# Patient Record
Sex: Female | Born: 1950
Health system: Southern US, Community
[De-identification: ages and names within clinical notes are randomized; demographics above are authoritative.]

## PROBLEM LIST (undated history)

## (undated) DIAGNOSIS — M7061 Trochanteric bursitis, right hip: Secondary | ICD-10-CM

## (undated) DIAGNOSIS — H04129 Dry eye syndrome of unspecified lacrimal gland: Secondary | ICD-10-CM

## (undated) DIAGNOSIS — M1990 Unspecified osteoarthritis, unspecified site: Secondary | ICD-10-CM

## (undated) DIAGNOSIS — G609 Hereditary and idiopathic neuropathy, unspecified: Secondary | ICD-10-CM

## (undated) DIAGNOSIS — F339 Major depressive disorder, recurrent, unspecified: Secondary | ICD-10-CM

## (undated) DIAGNOSIS — G2581 Restless legs syndrome: Secondary | ICD-10-CM

## (undated) DIAGNOSIS — H40013 Open angle with borderline findings, low risk, bilateral: Secondary | ICD-10-CM

## (undated) DIAGNOSIS — H903 Sensorineural hearing loss, bilateral: Secondary | ICD-10-CM

## (undated) DIAGNOSIS — H43813 Vitreous degeneration, bilateral: Secondary | ICD-10-CM

## (undated) DIAGNOSIS — J309 Allergic rhinitis, unspecified: Secondary | ICD-10-CM

## (undated) DIAGNOSIS — K219 Gastro-esophageal reflux disease without esophagitis: Secondary | ICD-10-CM

## (undated) DIAGNOSIS — Z170 Estrogen receptor positive status [ER+]: Secondary | ICD-10-CM

## (undated) DIAGNOSIS — E785 Hyperlipidemia, unspecified: Secondary | ICD-10-CM

## (undated) DIAGNOSIS — C50912 Malignant neoplasm of unspecified site of left female breast: Secondary | ICD-10-CM

## (undated) DIAGNOSIS — H35072 Retinal telangiectasis, left eye: Secondary | ICD-10-CM

## (undated) DIAGNOSIS — C50512 Malignant neoplasm of lower-outer quadrant of left female breast: Secondary | ICD-10-CM

## (undated) DIAGNOSIS — H35371 Puckering of macula, right eye: Secondary | ICD-10-CM

## (undated) DIAGNOSIS — G4700 Insomnia, unspecified: Secondary | ICD-10-CM

## (undated) DIAGNOSIS — I10 Essential (primary) hypertension: Secondary | ICD-10-CM

## (undated) DIAGNOSIS — Z961 Presence of intraocular lens: Secondary | ICD-10-CM

## (undated) HISTORY — DX: Retinal telangiectasis, left eye: H35.072

## (undated) HISTORY — DX: Allergic rhinitis, unspecified: J30.9

## (undated) HISTORY — PX: BREAST LUMPECTOMY: SHX2

## (undated) HISTORY — DX: Puckering of macula, right eye: H35.371

## (undated) HISTORY — DX: Estrogen receptor positive status (ER+): Z17.0

## (undated) HISTORY — DX: Major depressive disorder, recurrent, unspecified: F33.9

## (undated) HISTORY — DX: Restless legs syndrome: G25.81

## (undated) HISTORY — DX: Trochanteric bursitis, right hip: M70.61

## (undated) HISTORY — DX: Essential (primary) hypertension: I10

## (undated) HISTORY — DX: Estrogen receptor positive status (ER+): C50.512

## (undated) HISTORY — PX: REPLACEMENT TOTAL KNEE BILATERAL: SUR1225

## (undated) HISTORY — DX: Hereditary and idiopathic neuropathy, unspecified: G60.9

## (undated) HISTORY — DX: Dry eye syndrome of unspecified lacrimal gland: H04.129

## (undated) HISTORY — DX: Sensorineural hearing loss, bilateral: H90.3

## (undated) HISTORY — DX: Insomnia, unspecified: G47.00

## (undated) HISTORY — DX: Unspecified osteoarthritis, unspecified site: M19.90

## (undated) HISTORY — DX: Hyperlipidemia, unspecified: E78.5

## (undated) HISTORY — DX: Gastro-esophageal reflux disease without esophagitis: K21.9

## (undated) HISTORY — DX: Malignant neoplasm of unspecified site of left female breast: C50.912

## (undated) HISTORY — DX: Presence of intraocular lens: Z96.1

## (undated) HISTORY — PX: ROTATOR CUFF REPAIR: SHX139

## (undated) HISTORY — DX: Open angle with borderline findings, low risk, bilateral: H40.013

## (undated) HISTORY — DX: Vitreous degeneration, bilateral: H43.813

---

## 2019-01-03 ENCOUNTER — Ambulatory Visit (INDEPENDENT_AMBULATORY_CARE_PROVIDER_SITE_OTHER): Payer: Medicare Other | Admitting: Allergy and Immunology

## 2019-01-03 ENCOUNTER — Encounter: Payer: Self-pay | Admitting: Allergy and Immunology

## 2019-01-03 VITALS — BP 152/70 | HR 80 | Temp 98.5°F | Resp 12 | Ht 63.5 in | Wt 237.6 lb

## 2019-01-03 DIAGNOSIS — J3089 Other allergic rhinitis: Secondary | ICD-10-CM

## 2019-01-03 DIAGNOSIS — L299 Pruritus, unspecified: Secondary | ICD-10-CM | POA: Diagnosis not present

## 2019-01-03 DIAGNOSIS — K219 Gastro-esophageal reflux disease without esophagitis: Secondary | ICD-10-CM

## 2019-01-03 DIAGNOSIS — J4540 Moderate persistent asthma, uncomplicated: Secondary | ICD-10-CM

## 2019-01-03 MED ORDER — FLUTICASONE PROPIONATE 50 MCG/ACT NA SUSP: NASAL | 5 refills | Status: DC

## 2019-01-03 MED ORDER — OMEPRAZOLE 40 MG PO CPDR: DELAYED_RELEASE_CAPSULE | ORAL | 1 refills | Status: DC

## 2019-01-03 NOTE — Progress Notes (Signed)
NEW PATIENT NOTE  Referring Provider: No ref. provider found Primary Provider: Leota Jacobsen, MD Date of office visit: 01/03/2019    Subjective:   Chief Complaint:  Brittany Berger (DOB: October 23, 1951) is a 68 y.o. female who presents to the clinic on 01/03/2019 with a chief complaint of Pruritis .  HPI: Brittany Berger presents to this clinic in evaluation of multiple issues.  First, she has been itching since July 2019.  She never really notices a rash but she is itching "deep inside".  There appears to be predilection for her buttocks and thighs but any area of her body can be itchy.  This is disturbing her sleep even though she consistently uses hydroxyzine twice a day and occasionally uses Benadryl.  She has been given triamcinolone cream which does not really help her at all.  She takes no oatmeal bath.  She has seen a dermatologist last week and apparently no specific therapy was prescribed.  Her skin history goes back to an episode of folliculitis in the spring that was treated with 3 months of doxycycline which she finished in May 2019.  She has eliminated the use of triamterene and hydrochlorothiazide for the past 2 weeks and she has eliminated the use of letrozole for the past 2 months yet still remains itchy.  Second, she apparently underwent chemotherapy, radiation, and immunotherapy for breast cancer which she finished in July 2019.  Third, she has cough throughout the day.  She has a morning cough where she coughs up ugly sputum and this has been present for years.  Apparently she was told that she has asthma and she has been using a short acting bronchodilator every morning.  Fourth, she has persistent nasal congestion and sneezing and she has a diminished ability to smell.  She does not really notice any ugly nasal discharge.  She is using her husband's nose spray that he receives from the [place].  Fifth, she has the sensation that there is something in her throat.  She gets hoarseness.   She does have a history of reflux requiring esophageal dilation for food obstruction in the past and she is currently using Prilosec.  She drinks 2 coffees per day and occasionally a soda.  Past Medical History:  Diagnosis Date  . Allergic rhinitis   . Dry eye syndrome   . Epiretinal membrane (ERM) of right eye   . GERD (gastroesophageal reflux disease)   . Hyperlipidemia   . Hypertension   . Idiopathic peripheral neuropathy   . Insomnia   . Invasive ductal carcinoma of breast, female, left ([place])   . Malignant neoplasm of lower-outer quadrant of left breast of female, estrogen receptor positive ([place])   . Open angle with borderline findings and low glaucoma risk in both eyes   . Osteoarthritis   . Pseudophakia of both eyes   . PVD (posterior vitreous detachment), both eyes   . Recurrent major depressive disorder ([place])   . Restless leg syndrome   . Retinal telangiectasis of left eye   . Sensorineural hearing loss (SNHL) of both ears   . Trochanteric bursitis of right hip     Past Surgical History:  Procedure Laterality Date  . BREAST LUMPECTOMY    . REPLACEMENT TOTAL KNEE BILATERAL    . ROTATOR CUFF REPAIR Bilateral     Allergies as of 01/03/2019      Reactions   Pregabalin Shortness Of Breath   Ace Inhibitors Other (See Comments)   Hydromorphone Itching  Meperidine Itching      Medication List      albuterol 108 (90 Base) MCG/ACT inhaler Commonly known as:  PROVENTIL HFA;VENTOLIN HFA Inhale 2 puffs every 4 to 6 hours as needed for wheezing.   amLODipine 5 MG tablet Commonly known as:  NORVASC TAKE 1 TABLET EVERY DAY   atorvastatin 10 MG tablet Commonly known as:  LIPITOR   baclofen 10 MG tablet Commonly known as:  LIORESAL Take 10 mg by mouth at bedtime.   escitalopram 10 MG tablet Commonly known as:  LEXAPRO   furosemide 20 MG tablet Commonly known as:  LASIX Take 20 mg by mouth daily as needed.   gabapentin 600 MG tablet Commonly known as:   NEURONTIN   hydrOXYzine 25 MG tablet Commonly known as:  ATARAX/VISTARIL TAKE 1 TABLET BY MOUTH EVERY 4 HOURS AS NEEDED FOR ITCHING   letrozole 2.5 MG tablet Commonly known as:  FEMARA Take by mouth.   LORazepam 1 MG tablet Commonly known as:  ATIVAN Take 1 mg by mouth at bedtime.   montelukast 10 MG tablet Commonly known as:  SINGULAIR   omeprazole 20 MG capsule Commonly known as:  PRILOSEC   potassium chloride 10 MEQ CR capsule Commonly known as:  MICRO-K Take by mouth.   QUEtiapine 100 MG tablet Commonly known as:  SEROQUEL TAKE 0.5 1 (ONE HALF TO ONE) TABLET BY MOUTH AT BEDTIME FOR MOOD STABILIZER INSOMNIA   SM OYSTER SHELL CALCIUM/VIT D3 PO Take by mouth.   triamcinolone cream 0.1 % Commonly known as:  KENALOG APPLY CREAM EXTERNALLY TO AFFECTED AREA TWICE DAILY   triamterene-hydrochlorothiazide 37.5-25 MG capsule Commonly known as:  DYAZIDE   Vitamin B-Complex Tabs Take by mouth.   zolpidem 10 MG tablet Commonly known as:  AMBIEN TAKE 1 2 TO 1 (ONE HALF TO ONE) TABLET BY MOUTH EVERY DAY AT BEDTIME AS NEEDED FOR INSOMNIA       Review of systems negative except as noted in HPI / PMHx or noted below:  Review of Systems  Constitutional: Negative.   HENT: Negative.   Eyes: Negative.   Respiratory: Negative.   Cardiovascular: Negative.   Gastrointestinal: Negative.   Genitourinary: Negative.   Musculoskeletal: Negative.   Skin: Negative.   Neurological: Negative.   Endo/Heme/Allergies: Negative.   Psychiatric/Behavioral: Negative.     Family History  Problem Relation Age of Onset  . Emphysema Father   . Aneurysm Father   . Allergic rhinitis Sister   . Cancer Sister   . Allergic rhinitis Sister     Social History   Socioeconomic History  . Marital status: Married    Spouse name: Not on file  . Number of children: Not on file  . Years of education: Not on file  . Highest education level: Not on file  Occupational History  . Not on file   Social Needs  . Financial resource strain: Not on file  . Food insecurity:    Worry: Not on file    Inability: Not on file  . Transportation needs:    Medical: Not on file    Non-medical: Not on file  Tobacco Use  . Smoking status: Never Smoker  . Smokeless tobacco: Never Used  Substance and Sexual Activity  . Alcohol use: Not on file  . Drug use: Not on file  . Sexual activity: Not on file  Lifestyle  . Physical activity:    Days per week: Not on file    Minutes per session:  Not on file  . Stress: Not on file  Relationships  . Social connections:    Talks on phone: Not on file    Gets together: Not on file    Attends religious service: Not on file    Active member of club or organization: Not on file    Attends meetings of clubs or organizations: Not on file    Relationship status: Not on file  . Intimate partner violence:    Fear of current or ex partner: Not on file    Emotionally abused: Not on file    Physically abused: Not on file    Forced sexual activity: Not on file  Other Topics Concern  . Not on file  Social History Narrative  . Not on file    Environmental and Social history  Lives in a house with a dry environment, dogs located inside the household, no carpet in the bedroom, no plastic on the bed, no plastic on the pillow, no smokers located inside the household.  Objective:   Vitals:   01/03/19 1024  BP: (!) 152/70  Pulse: 80  Resp: 12  Temp: 98.5 F (36.9 C)   Height: 5' 3.5" (161.3 cm) Weight: 237 lb 9.6 oz (107.8 kg)  Physical Exam Constitutional:      Appearance: She is not diaphoretic.     Comments: Nasal voice, throat clearing  HENT:     Head: Normocephalic. No right periorbital erythema or left periorbital erythema.     Right Ear: Tympanic membrane, ear canal and external ear normal.     Left Ear: Tympanic membrane, ear canal and external ear normal.     Nose: Mucosal edema present. No rhinorrhea.     Mouth/Throat:     Mouth:  Mucous membranes are dry.     Pharynx: Uvula midline. No oropharyngeal exudate.  Eyes:     General: Lids are normal.     Conjunctiva/sclera: Conjunctivae normal.     Pupils: Pupils are equal, round, and reactive to light.  Neck:     Thyroid: No thyromegaly.     Trachea: Trachea normal. No tracheal tenderness or tracheal deviation.  Cardiovascular:     Rate and Rhythm: Normal rate and regular rhythm.     Heart sounds: Normal heart sounds, S1 normal and S2 normal. No murmur.  Pulmonary:     Effort: Pulmonary effort is normal. No respiratory distress.     Breath sounds: Normal breath sounds. No stridor. No wheezing or rales.  Chest:     Chest wall: No tenderness.  Abdominal:     General: There is no distension.     Palpations: Abdomen is soft. There is no mass.     Tenderness: There is no abdominal tenderness. There is no guarding or rebound.  Musculoskeletal:        General: No tenderness.  Lymphadenopathy:     Head:     Right side of head: No tonsillar adenopathy.     Left side of head: No tonsillar adenopathy.     Cervical: No cervical adenopathy.  Skin:    Coloration: Skin is not pale.     Findings: No erythema or rash.     Nails: There is no clubbing.   Neurological:     Mental Status: She is alert.       Diagnostics: Allergy skin tests were not performed secondary to the recent use of a antihistamine.   Spirometry was performed and demonstrated an FEV1 of 1.52 @ 65 %  of predicted. FEV1/FVC = 0.75.  Following administration of nebulized albuterol her FEV1 rose to 1.77 which was an increase in the FEV1 of 16%.   Results of a head CT scan obtained 16 August 2018 identified no acute findings in sinus/orbits.  Results of blood tests obtained 19 December 2018 identified creatinine 0.92 mg/DL, AST 19 U/L, ALT 20 4U/L, WBC 8.1, absolute eosinophil 200, absolute lymphocyte 2800, hemoglobin 13.7, platelet 194.   Assessment and Plan:    1. Pruritic disorder   2. Not well  controlled moderate persistent asthma   3. Perennial allergic rhinitis   4. LPRD (laryngopharyngeal reflux disease)     1.  Allergen avoidance measures?  2.  Obtain blood - IgA/G/M, TSH, T4, TP, area 2 profile  3.  Obtain chest x-ray  4.  Treat and prevent inflammation:   A.  Symbicort 160 - 2 inhalations twice a day with spacer  B.  Flonase -1 spray each nostril twice a day  C.  Montelukast 10 mg -1 tablet 1 time per day  5.  Treat and prevent reflux:   A.  Slowly taper off all forms of caffeine  B.  Increase omeprazole 40 mg twice a day  6.  Raise itch threshold:   A.  Xyzal 5 mg -1 tablet twice a day  B.  Diphenhydramine 25 mg -1 tablet twice a day  C.  Eliminate use of hydroxyzine  D.  Shower/bath immediately followed by Vaseline  7.  If needed:   A.  Nasal saline  B.  Albuterol MDI 2 inhalations every 4-6 hours  8.  Return to clinic in 3 weeks or earlier if problem  The etiologic factor responsible for Aniqua's pruritic disorder is not entirely clear but we will screen her blood and also image her airway looking for a systemic disease contributing to her this immunological hyperreactivity.  As well, she appears to have inflammation of her airway and active reflux disease and we will address those issues with the therapy noted above.  Finally, she did have rather extensive chemotherapy administered for her breast cancer and she does appear to have a history of having recurrent purulent sputum and we will check her immunoglobulin levels to make sure that her B-cell immune system is working. I will see her back in his clinic in 3 weeks or earlier if there is a problem.  Allena Katz, MD Allergy / Immunology [place]

## 2019-01-03 NOTE — Patient Instructions (Addendum)
  1.  Allergen avoidance measures?  2.  Obtain blood - IgA/G/M, TSH, T4, TP, area 2 profile  3.  Obtain chest x-ray  4.  Treat and prevent inflammation:   A.  Symbicort 160 - 2 inhalations twice a day with spacer  B.  Flonase -1 spray each nostril twice a day  C.  Montelukast 10 mg -1 tablet 1 time per day  5.  Treat and prevent reflux:   A.  Slowly taper off all forms of caffeine  B.  Increase omeprazole 40 mg twice a day  6.  Raise itch threshold:   A.  Xyzal 5 mg -1 tablet twice a day  B.  Diphenhydramine 25 mg -1 tablet twice a day  C.  Eliminate use of hydroxyzine  D.  Shower/bath immediately followed by Vaseline  7.  If needed:   A.  Nasal saline  B.  Albuterol MDI 2 inhalations every 4-6 hours  8.  Return to clinic in 3 weeks or earlier if problem

## 2019-01-04 ENCOUNTER — Encounter: Payer: Self-pay | Admitting: Allergy and Immunology

## 2019-01-07 LAB — ALLERGENS W/TOTAL IGE AREA 2
Alternaria Alternata IgE: <0.1 kU/L
Aspergillus Fumigatus IgE: <0.1 kU/L
Bermuda Grass IgE: <0.1 kU/L
Cedar, Mountain IgE: <0.1 kU/L
Cockroach, German IgE: 0.2 kU/L — AB
Common Silver Birch IgE: <0.1 kU/L
D Farinae IgE: 6.03 kU/L — AB
D Pteronyssinus IgE: 13.3 kU/L — AB
Dog Dander IgE: 0.7 kU/L — AB
E001-IGE CAT DANDER: 0.4 kU/L — AB
Elm, American IgE: 0.14 kU/L — AB
IgE (Immunoglobulin E), Serum: 100 IU/mL (ref 6–495)
Johnson Grass IgE: <0.1 kU/L
Maple/Box Elder IgE: <0.1 kU/L
Mouse Urine IgE: <0.1 kU/L
Oak, White IgE: <0.1 kU/L
Pecan, Hickory IgE: 1.1 kU/L — AB
Penicillium Chrysogen IgE: <0.1 kU/L
Pigweed, Rough IgE: 0.11 kU/L — AB
Ragweed, Short IgE: 0.49 kU/L — AB
Sheep Sorrel IgE Qn: <0.1 kU/L
Timothy Grass IgE: 0.18 kU/L — AB
White Mulberry IgE: <0.1 kU/L

## 2019-01-07 LAB — THYROID PEROXIDASE ANTIBODY: Thyroperoxidase Ab SerPl-aCnc: <6 IU/mL (ref 0–34)

## 2019-01-07 LAB — IGG, IGA, IGM
IgA/Immunoglobulin A, Serum: 181 mg/dL (ref 87–352)
IgG (Immunoglobin G), Serum: 899 mg/dL (ref 700–1600)
IgM (Immunoglobulin M), Srm: 38 mg/dL (ref 26–217)

## 2019-01-07 LAB — TSH+FREE T4
Free T4: 1.14 ng/dL (ref 0.82–1.77)
TSH: 1.53 u[IU]/mL (ref 0.450–4.500)

## 2019-01-09 ENCOUNTER — Encounter: Payer: Self-pay | Admitting: *Deleted

## 2019-01-24 ENCOUNTER — Encounter: Payer: Self-pay | Admitting: Allergy and Immunology

## 2019-01-24 ENCOUNTER — Ambulatory Visit (INDEPENDENT_AMBULATORY_CARE_PROVIDER_SITE_OTHER): Payer: Medicare Other | Admitting: Allergy and Immunology

## 2019-01-24 VITALS — BP 120/70 | HR 78 | Resp 18

## 2019-01-24 DIAGNOSIS — K219 Gastro-esophageal reflux disease without esophagitis: Secondary | ICD-10-CM

## 2019-01-24 DIAGNOSIS — J4540 Moderate persistent asthma, uncomplicated: Secondary | ICD-10-CM

## 2019-01-24 DIAGNOSIS — J3089 Other allergic rhinitis: Secondary | ICD-10-CM | POA: Diagnosis not present

## 2019-01-24 DIAGNOSIS — L299 Pruritus, unspecified: Secondary | ICD-10-CM

## 2019-01-24 DIAGNOSIS — G4733 Obstructive sleep apnea (adult) (pediatric): Secondary | ICD-10-CM

## 2019-01-24 NOTE — Patient Instructions (Addendum)
  1.  Continue to treat and prevent inflammation:   A.  Symbicort 160 - 2 inhalations twice a day with spacer  B.  Flonase -1 spray each nostril twice a day  C.  Montelukast 10 mg -1 tablet 1 time per day  2.  Continue to treat and prevent reflux:   A.  Slowly taper off all forms of caffeine  B.  omeprazole 40 mg twice a day  3.  Continue to raise itch threshold:   A.  Xyzal 5 mg -1 tablet twice a day  B.  Diphenhydramine 25 mg -1 tablet twice a day  C.  Shower/bath immediately followed by Vaseline or triamcinolone  4.  If needed:   A.  Nasal saline  B.  Albuterol MDI 2 inhalations every 4-6 hours  5.  Return to clinic in 8 weeks or earlier if problem  6. Arrange for sleep study

## 2019-01-24 NOTE — Progress Notes (Signed)
Follow-up Note  Referring Provider: No ref. provider found Primary Provider: Leota Jacobsen, MD Date of Office Visit: 01/24/2019  Subjective:   Brittany Berger (DOB: 18-Aug-1951) is a 68 y.o. female who returns to the [facility] [place] on 01/24/2019 in re-evaluation of the following:  HPI: Brittany Berger returns to this clinic in reevaluation of multiple issues addressed during her initial evaluation of 03 January 2019.  One of her major complaints during her last visit was pruritus.  She still feels pretty itchy on a regular basis.  She has manipulated her medications and she is now using amlodipine to treat her reflux and her Lexapro has been discontinued and she started Wellbutrin last week.  Previously she eliminated triamcinolone and hydrochlorothiazide and letrozole.  She also appeared to have some cough when she presented to this clinic.  She was started on an inhaled steroid and her cough is much better at this point.  She still does have some throat clearing and feeling as though there might be a coating in her throat.  She does have a history of reflux and we had her increase her omeprazole to twice a day and attempt to discontinue caffeine during her last visit.  She is now down to drinking 1 coffee in the morning.  She does not have any classic reflux symptoms at this point.  Her nasal congestion and sneezing is much better at this point in time and now she can smell.  She apparently developed some right-sided lateral tongue pain and this appeared to migrate up into her ear and down into her throat for which he underwent a biopsy of her tongue yesterday by [facility].  She also relates a history of having morning headaches.  Her sleep might be somewhat fractured at night.  When she wakes up in the morning she does not really feel that refreshed but she does not take a nap through the day and she does not get sleepy driving the car.  Allergies as of 01/24/2019      Reactions   Pregabalin Shortness Of Breath   Ace Inhibitors Other (See Comments)   Hydromorphone Itching   Meperidine Itching      Medication List      albuterol 108 (90 Base) MCG/ACT inhaler Commonly known as:  PROVENTIL HFA;VENTOLIN HFA Inhale 2 puffs every 4 to 6 hours as needed for wheezing.   amLODipine 5 MG tablet Commonly known as:  NORVASC TAKE 1 TABLET EVERY DAY   baclofen 10 MG tablet Commonly known as:  LIORESAL Take 10 mg by mouth at bedtime.   buPROPion 150 MG 24 hr tablet Commonly known as:  WELLBUTRIN XL Take by mouth.   fluticasone 50 MCG/ACT nasal spray Commonly known as:  FLONASE Use one spray in each nostril twice daily   furosemide 20 MG tablet Commonly known as:  LASIX Take 20 mg by mouth daily as needed.   gabapentin 600 MG tablet Commonly known as:  NEURONTIN   LORazepam 1 MG tablet Commonly known as:  ATIVAN Take 1 mg by mouth at bedtime.   montelukast 10 MG tablet Commonly known as:  SINGULAIR   omeprazole 40 MG capsule Commonly known as:  PRILOSEC Take one capsule twice daily as directed   potassium chloride 10 MEQ CR capsule Commonly known as:  MICRO-K Take by mouth.   QUEtiapine 100 MG tablet Commonly known as:  SEROQUEL TAKE 0.5 1 (ONE HALF TO ONE) TABLET BY MOUTH AT BEDTIME FOR  MOOD STABILIZER INSOMNIA   SM OYSTER SHELL CALCIUM/VIT D3 PO Take by mouth.   triamcinolone cream 0.1 % Commonly known as:  KENALOG APPLY CREAM EXTERNALLY TO AFFECTED AREA TWICE DAILY   Vitamin B-Complex Tabs Take by mouth.   zolpidem 10 MG tablet Commonly known as:  AMBIEN TAKE 1 2 TO 1 (ONE HALF TO ONE) TABLET BY MOUTH EVERY DAY AT BEDTIME AS NEEDED FOR INSOMNIA       Past Medical History:  Diagnosis Date  . Allergic rhinitis   . Dry eye syndrome   . Epiretinal membrane (ERM) of right eye   . GERD (gastroesophageal reflux disease)   . Hyperlipidemia   . Hypertension   . Idiopathic peripheral neuropathy   . Insomnia   . Invasive ductal  carcinoma of breast, female, left ([place])   . Malignant neoplasm of lower-outer quadrant of left breast of female, estrogen receptor positive ([place])   . Open angle with borderline findings and low glaucoma risk in both eyes   . Osteoarthritis   . Pseudophakia of both eyes   . PVD (posterior vitreous detachment), both eyes   . Recurrent major depressive disorder ([place])   . Restless leg syndrome   . Retinal telangiectasis of left eye   . Sensorineural hearing loss (SNHL) of both ears   . Trochanteric bursitis of right hip     Past Surgical History:  Procedure Laterality Date  . BREAST LUMPECTOMY    . REPLACEMENT TOTAL KNEE BILATERAL    . ROTATOR CUFF REPAIR Bilateral     Review of systems negative except as noted in HPI / PMHx or noted below:  Review of Systems  Constitutional: Negative.   HENT: Negative.   Eyes: Negative.   Respiratory: Negative.   Cardiovascular: Negative.   Gastrointestinal: Negative.   Genitourinary: Negative.   Musculoskeletal: Negative.   Skin: Negative.   Neurological: Negative.   Endo/Heme/Allergies: Negative.   Psychiatric/Behavioral: Negative.      Objective:   Vitals:   01/24/19 1108  BP: 120/70  Pulse: 78  Resp: 18  SpO2: 98%           Physical Exam Constitutional:      Appearance: She is not diaphoretic.  HENT:     Head: Normocephalic.     Right Ear: Tympanic membrane, ear canal and external ear normal.     Left Ear: Tympanic membrane, ear canal and external ear normal.     Nose: Nose normal. No mucosal edema or rhinorrhea.     Mouth/Throat:     Tongue: Lesions (Ulcer right lateral) present.     Pharynx: Uvula midline. No oropharyngeal exudate.  Eyes:     Conjunctiva/sclera: Conjunctivae normal.  Neck:     Thyroid: No thyromegaly.     Trachea: Trachea normal. No tracheal tenderness or tracheal deviation.  Cardiovascular:     Rate and Rhythm: Normal rate and regular rhythm.     Heart sounds: Normal heart sounds, S1 normal and  S2 normal. No murmur.  Pulmonary:     Effort: No respiratory distress.     Breath sounds: Normal breath sounds. No stridor. No wheezing or rales.  Lymphadenopathy:     Head:     Right side of head: No tonsillar adenopathy.     Left side of head: No tonsillar adenopathy.     Cervical: No cervical adenopathy.  Skin:    Findings: No erythema or rash.     Nails: There is no clubbing.   Neurological:  Mental Status: She is alert.     Diagnostics: Spirometry was not performed secondary to her recent tongue biopsy.  The patient had an Asthma Control Test with the following results: ACT Total Score: 16.    Results of blood tests obtained 04 January 2019 identifies IgG 899 mg/DL, IgM 38 mg/DL, IgA 181 mg/DL, IgE 100 IU/mL, antigen specific IgE antibodies directed against house dust mite, cat, dog, and various pollens, TSH 1.53 IU/mL, T4 1.14 NG/DL, thyroid peroxidase antibody less than 6 IU/mL.  Results of a chest x-ray obtained 04 January 2019 identified no significant abnormality.  Assessment and Plan:   1. Not well controlled moderate persistent asthma   2. Perennial allergic rhinitis   3. Pruritic disorder   4. LPRD (laryngopharyngeal reflux disease)   5. Obstructive sleep apnea syndrome     1.  Continue to treat and prevent inflammation:   A.  Symbicort 160 - 2 inhalations twice a day with spacer  B.  Flonase -1 spray each nostril twice a day  C.  Montelukast 10 mg -1 tablet 1 time per day  2.  Continue to treat and prevent reflux:   A.  Slowly taper off all forms of caffeine  B.  omeprazole 40 mg twice a day  3.  Continue to raise itch threshold:   A.  Xyzal 5 mg -1 tablet twice a day  B.  Diphenhydramine 25 mg -1 tablet twice a day  C.  Shower/bath immediately followed by Vaseline or triamcinolone  4.  If needed:   A.  Nasal saline  B.  Albuterol MDI 2 inhalations every 4-6 hours  5.  Return to clinic in 8 weeks or earlier if problem  6. Arrange for sleep  study  Brittany Berger will continue to utilize therapy directed against respiratory tract inflammation which has resulted in very good control of her inflamed airway and she will continue to treat reflux which appears to have resulted in pretty good control of her LPR.  She is still itchy.  Now that she discontinued her Lexapro we will need to wait 6 weeks to see if that was the culprit giving rise to her pruritic disorder.  She does have a history that suggest that she might have some sleep apnea and we will arrange for a sleep study to be performed.  I will see her back in this clinic in 8 weeks or earlier if there is a problem.  Allena Katz, MD Allergy / Immunology [place]

## 2019-01-25 ENCOUNTER — Encounter: Payer: Self-pay | Admitting: Allergy and Immunology

## 2019-03-02 ENCOUNTER — Other Ambulatory Visit: Payer: Self-pay

## 2019-03-02 ENCOUNTER — Telehealth: Payer: Self-pay | Admitting: Allergy and Immunology

## 2019-03-02 MED ORDER — BUDESONIDE-FORMOTEROL FUMARATE 160-4.5 MCG/ACT IN AERO: INHALATION_SPRAY | RESPIRATORY_TRACT | 5 refills | Status: DC

## 2019-03-02 NOTE — Telephone Encounter (Signed)
Script for Symbicort sent to requested pharmacy

## 2019-03-02 NOTE — Telephone Encounter (Signed)
Brittany Berger called in and stated that Dr. Neldon Mc had given her SYMBICORT samples to try and she would like a prescription now.  She would like a prescription for [facility] sent to [facility] in [place].

## 2019-03-05 ENCOUNTER — Telehealth: Payer: Self-pay | Admitting: Allergy and Immunology

## 2019-03-05 NOTE — Telephone Encounter (Signed)
Navika called in and stated when she went to pick up [facility] it was almost $300.  She would like to know if there is anything cheaper she can get?  Please advise.

## 2019-03-05 NOTE — Telephone Encounter (Signed)
Spoke with patient and informed her that it looks like Symbicort, Advair, [facility] are all covered; however, they are all tier 3's and her copay is $375. Patient can attempt to apply for [place]. I told patient we would give her a few samples to get her until she applied for patient assistance. Patient voiced understanding.

## 2019-03-05 NOTE — Telephone Encounter (Signed)
Please advise Dr. Kozlow.  

## 2019-03-05 NOTE — Telephone Encounter (Signed)
Please work through patient requiring what is cheaper on her formulary.

## 2019-03-22 ENCOUNTER — Ambulatory Visit: Payer: Medicare Other | Admitting: Allergy and Immunology

## 2020-08-02 ENCOUNTER — Inpatient Hospital Stay (HOSPITAL_COMMUNITY)
Admission: EM | Admit: 2020-08-02 | Discharge: 2020-08-06 | DRG: 511 | Disposition: A | Discharge status: Home Health | Payer: Medicare Other | Attending: General Surgery | Admitting: General Surgery

## 2020-08-02 ENCOUNTER — Emergency Department (HOSPITAL_COMMUNITY): Payer: Medicare Other

## 2020-08-02 DIAGNOSIS — W19XXXA Unspecified fall, initial encounter: Secondary | ICD-10-CM | POA: Diagnosis present

## 2020-08-02 DIAGNOSIS — S52572A Other intraarticular fracture of lower end of left radius, initial encounter for closed fracture: Secondary | ICD-10-CM | POA: Diagnosis not present

## 2020-08-02 DIAGNOSIS — Z96653 Presence of artificial knee joint, bilateral: Secondary | ICD-10-CM | POA: Diagnosis present

## 2020-08-02 DIAGNOSIS — S2242XA Multiple fractures of ribs, left side, initial encounter for closed fracture: Secondary | ICD-10-CM

## 2020-08-02 DIAGNOSIS — M25562 Pain in left knee: Secondary | ICD-10-CM | POA: Diagnosis present

## 2020-08-02 DIAGNOSIS — S0003XA Contusion of scalp, initial encounter: Secondary | ICD-10-CM | POA: Diagnosis present

## 2020-08-02 DIAGNOSIS — M25532 Pain in left wrist: Secondary | ICD-10-CM | POA: Diagnosis not present

## 2020-08-02 DIAGNOSIS — Y92018 Other place in single-family (private) house as the place of occurrence of the external cause: Secondary | ICD-10-CM

## 2020-08-02 DIAGNOSIS — Z7951 Long term (current) use of inhaled steroids: Secondary | ICD-10-CM

## 2020-08-02 DIAGNOSIS — Z6841 Body Mass Index (BMI) 40.0 and over, adult: Secondary | ICD-10-CM

## 2020-08-02 DIAGNOSIS — Z20822 Contact with and (suspected) exposure to covid-19: Secondary | ICD-10-CM | POA: Diagnosis present

## 2020-08-02 DIAGNOSIS — D62 Acute posthemorrhagic anemia: Secondary | ICD-10-CM | POA: Diagnosis not present

## 2020-08-02 DIAGNOSIS — Z79899 Other long term (current) drug therapy: Secondary | ICD-10-CM

## 2020-08-02 DIAGNOSIS — M25552 Pain in left hip: Secondary | ICD-10-CM | POA: Diagnosis present

## 2020-08-02 DIAGNOSIS — E785 Hyperlipidemia, unspecified: Secondary | ICD-10-CM | POA: Diagnosis present

## 2020-08-02 DIAGNOSIS — Z79811 Long term (current) use of aromatase inhibitors: Secondary | ICD-10-CM

## 2020-08-02 DIAGNOSIS — G2581 Restless legs syndrome: Secondary | ICD-10-CM | POA: Diagnosis present

## 2020-08-02 DIAGNOSIS — Z170 Estrogen receptor positive status [ER+]: Secondary | ICD-10-CM

## 2020-08-02 DIAGNOSIS — G609 Hereditary and idiopathic neuropathy, unspecified: Secondary | ICD-10-CM | POA: Diagnosis present

## 2020-08-02 DIAGNOSIS — W108XXA Fall (on) (from) other stairs and steps, initial encounter: Secondary | ICD-10-CM | POA: Diagnosis present

## 2020-08-02 DIAGNOSIS — J939 Pneumothorax, unspecified: Secondary | ICD-10-CM

## 2020-08-02 DIAGNOSIS — S270XXA Traumatic pneumothorax, initial encounter: Secondary | ICD-10-CM | POA: Diagnosis present

## 2020-08-02 DIAGNOSIS — I10 Essential (primary) hypertension: Secondary | ICD-10-CM | POA: Diagnosis present

## 2020-08-02 DIAGNOSIS — K219 Gastro-esophageal reflux disease without esophagitis: Secondary | ICD-10-CM | POA: Diagnosis present

## 2020-08-02 DIAGNOSIS — Z853 Personal history of malignant neoplasm of breast: Secondary | ICD-10-CM

## 2020-08-02 LAB — COMPREHENSIVE METABOLIC PANEL
ALT: 26 U/L (ref 0–44)
AST: 28 U/L (ref 15–41)
Albumin: 3.7 g/dL (ref 3.5–5.0)
Alkaline Phosphatase: 124 U/L (ref 38–126)
Anion gap: 10 (ref 5–15)
BUN: 24 mg/dL — ABNORMAL HIGH (ref 8–23)
CO2: 25 mmol/L (ref 22–32)
Calcium: 8.8 mg/dL — ABNORMAL LOW (ref 8.9–10.3)
Chloride: 104 mmol/L (ref 98–111)
Creatinine, Ser: 1.04 mg/dL — ABNORMAL HIGH (ref 0.44–1.00)
GFR calc Af Amer: >60 mL/min (ref >60)
GFR calc non Af Amer: 54 mL/min — ABNORMAL LOW (ref >60)
Glucose, Bld: 123 mg/dL — ABNORMAL HIGH (ref 70–99)
Potassium: 4 mmol/L (ref 3.5–5.1)
Sodium: 139 mmol/L (ref 135–145)
Total Bilirubin: 0.3 mg/dL (ref 0.3–1.2)
Total Protein: 6.2 g/dL — ABNORMAL LOW (ref 6.5–8.1)

## 2020-08-02 LAB — SARS CORONAVIRUS 2 BY RT PCR (HOSPITAL ORDER, PERFORMED IN ~~LOC~~ HOSPITAL LAB): SARS Coronavirus 2: NEGATIVE

## 2020-08-02 LAB — CBC WITH DIFFERENTIAL/PLATELET
Abs Immature Granulocytes: 0.11 10*3/uL — ABNORMAL HIGH (ref 0.00–0.07)
Basophils Absolute: 0.1 10*3/uL (ref 0.0–0.1)
Basophils Relative: 0 %
Eosinophils Absolute: 0.2 10*3/uL (ref 0.0–0.5)
Eosinophils Relative: 1 %
HCT: 37.6 % (ref 36.0–46.0)
Hemoglobin: 11.7 g/dL — ABNORMAL LOW (ref 12.0–15.0)
Immature Granulocytes: 1 %
Lymphocytes Relative: 13 %
Lymphs Abs: 1.9 10*3/uL (ref 0.7–4.0)
MCH: 28.7 pg (ref 26.0–34.0)
MCHC: 31.1 g/dL (ref 30.0–36.0)
MCV: 92.2 fL (ref 80.0–100.0)
Monocytes Absolute: 1.2 10*3/uL — ABNORMAL HIGH (ref 0.1–1.0)
Monocytes Relative: 8 %
Neutro Abs: 11 10*3/uL — ABNORMAL HIGH (ref 1.7–7.7)
Neutrophils Relative %: 77 %
Platelets: 217 10*3/uL (ref 150–400)
RBC: 4.08 MIL/uL (ref 3.87–5.11)
RDW: 13.4 % (ref 11.5–15.5)
WBC: 14.4 10*3/uL — ABNORMAL HIGH (ref 4.0–10.5)
nRBC: 0 % (ref 0.0–0.2)

## 2020-08-02 MED ORDER — SODIUM CHLORIDE 0.9 % IV BOLUS
1000.0000 mL | Freq: Once | INTRAVENOUS | Status: AC
  Administered 2020-08-02: 1000 mL via INTRAVENOUS

## 2020-08-02 MED ORDER — ZOLPIDEM TARTRATE 5 MG PO TABS
5.0000 mg | ORAL_TABLET | Freq: Every evening | ORAL | Status: DC | PRN
  Administered 2020-08-03 – 2020-08-04 (×2): 5 mg via ORAL
  Filled 2020-08-02 (×2): qty 1

## 2020-08-02 MED ORDER — BUPROPION HCL ER (XL) 150 MG PO TB24
150.0000 mg | ORAL_TABLET | Freq: Every day | ORAL | Status: DC
  Filled 2020-08-02: qty 1

## 2020-08-02 MED ORDER — FENTANYL CITRATE (PF) 100 MCG/2ML IJ SOLN
50.0000 ug | Freq: Once | INTRAMUSCULAR | Status: AC
  Administered 2020-08-02: 50 ug via INTRAVENOUS
  Filled 2020-08-02: qty 2

## 2020-08-02 MED ORDER — AMLODIPINE BESYLATE 5 MG PO TABS: 5.0000 mg | ORAL_TABLET | Freq: Every day | ORAL | Status: DC

## 2020-08-02 MED ORDER — PANTOPRAZOLE SODIUM 40 MG PO TBEC
40.0000 mg | DELAYED_RELEASE_TABLET | Freq: Every day | ORAL | Status: DC
  Administered 2020-08-03 – 2020-08-06 (×3): 40 mg via ORAL
  Filled 2020-08-02 (×3): qty 1

## 2020-08-02 MED ORDER — MONTELUKAST SODIUM 10 MG PO TABS
10.0000 mg | ORAL_TABLET | Freq: Every day | ORAL | Status: DC
  Administered 2020-08-02 – 2020-08-05 (×4): 10 mg via ORAL
  Filled 2020-08-02 (×4): qty 1

## 2020-08-02 MED ORDER — BACLOFEN 10 MG PO TABS: 10.0000 mg | ORAL_TABLET | Freq: Every day | ORAL | Status: DC

## 2020-08-02 MED ORDER — FENTANYL CITRATE (PF) 100 MCG/2ML IJ SOLN
75.0000 ug | Freq: Once | INTRAMUSCULAR | Status: AC
  Administered 2020-08-02: 75 ug via INTRAVENOUS
  Filled 2020-08-02: qty 2

## 2020-08-02 MED ORDER — LORAZEPAM 1 MG PO TABS
1.0000 mg | ORAL_TABLET | Freq: Every evening | ORAL | Status: DC | PRN
  Administered 2020-08-03 – 2020-08-06 (×2): 1 mg via ORAL
  Filled 2020-08-02 (×2): qty 1

## 2020-08-02 MED ORDER — FLUTICASONE PROPIONATE 50 MCG/ACT NA SUSP
1.0000 | Freq: Every day | NASAL | Status: DC
  Administered 2020-08-04 – 2020-08-06 (×2): 1 via NASAL
  Filled 2020-08-02: qty 16

## 2020-08-02 MED ORDER — LETROZOLE 2.5 MG PO TABS: 2.5000 mg | ORAL_TABLET | Freq: Every day | ORAL | Status: DC

## 2020-08-02 MED ORDER — UMECLIDINIUM BROMIDE 62.5 MCG/INH IN AEPB
1.0000 | INHALATION_SPRAY | Freq: Every day | RESPIRATORY_TRACT | Status: DC
  Administered 2020-08-04: 09:00:00 1 via RESPIRATORY_TRACT
  Filled 2020-08-02: qty 7

## 2020-08-02 MED ORDER — ALBUTEROL SULFATE (2.5 MG/3ML) 0.083% IN NEBU
3.0000 mL | INHALATION_SOLUTION | RESPIRATORY_TRACT | Status: DC | PRN
  Administered 2020-08-04: 3 mL via RESPIRATORY_TRACT
  Filled 2020-08-02: qty 3

## 2020-08-02 MED ORDER — MOMETASONE FURO-FORMOTEROL FUM 200-5 MCG/ACT IN AERO
2.0000 | INHALATION_SPRAY | Freq: Two times a day (BID) | RESPIRATORY_TRACT | Status: DC
  Administered 2020-08-04 – 2020-08-05 (×3): 2 via RESPIRATORY_TRACT
  Filled 2020-08-02: qty 8.8

## 2020-08-02 MED ORDER — IOHEXOL 300 MG/ML  SOLN
100.0000 mL | Freq: Once | INTRAMUSCULAR | Status: AC | PRN
  Administered 2020-08-02: 100 mL via INTRAVENOUS

## 2020-08-02 NOTE — ED Triage Notes (Signed)
Pt fell down 20 steps in a basement, endorses hitting her head, denies LOC. Left ribs, hip, wrist hurts. & a large hematoma to the back of her head. Arrives to ED A/Ox4.

## 2020-08-02 NOTE — ED Provider Notes (Signed)
[facility] EMERGENCY DEPARTMENT Provider Note   CSN: [id number] Arrival date & time: 08/02/20  1827     History Chief Complaint  Patient presents with  . Fall    Adriella Essex is a 69 y.o. female w PMHx HTN, breast cancer, presenting to the ED with complaint of mechanical fall down the stairs that occurred prior to arrival.  Patient states she was helping a friend move into a new home and when patient opened a door in the home she expected to walk into a room, however it was stairs and she fell down the flight of stairs.  She states they were an estimated 20 steps that she fell down, when she landed at the bottom she hit her head on the cement floor.  She did not lose consciousness.  She complains of pain to her left chest with associated pain with breathing and shortness of breath.  She also complains of pain to her left occipital scalp, left wrist, left thigh.  She denies neck pain or back pain, abdominal pain, pain to her right sided extremities.  EMS placed c-collar.  Patient is not on anticoagulation.  Denies numbness in her extremities, nausea, vision changes.  The history is provided by the patient.       Past Medical History:  Diagnosis Date  . Allergic rhinitis   . Dry eye syndrome   . Epiretinal membrane (ERM) of right eye   . GERD (gastroesophageal reflux disease)   . Hyperlipidemia   . Hypertension   . Idiopathic peripheral neuropathy   . Insomnia   . Invasive ductal carcinoma of breast, female, left ([place])   . Malignant neoplasm of lower-outer quadrant of left breast of female, estrogen receptor positive ([place])   . Open angle with borderline findings and low glaucoma risk in both eyes   . Osteoarthritis   . Pseudophakia of both eyes   . PVD (posterior vitreous detachment), both eyes   . Recurrent major depressive disorder ([place])   . Restless leg syndrome   . Retinal telangiectasis of left eye   . Sensorineural hearing loss (SNHL) of both ears   .  Trochanteric bursitis of right hip     There are no problems to display for this patient.   Past Surgical History:  Procedure Laterality Date  . BREAST LUMPECTOMY    . REPLACEMENT TOTAL KNEE BILATERAL    . ROTATOR CUFF REPAIR Bilateral      OB History   No obstetric history on file.     Family History  Problem Relation Age of Onset  . Emphysema Father   . Aneurysm Father   . Allergic rhinitis Sister   . Cancer Sister   . Allergic rhinitis Sister     Social History   Tobacco Use  . Smoking status: Never Smoker  . Smokeless tobacco: Never Used  Substance Use Topics  . Alcohol use: Not on file  . Drug use: Not on file    Home Medications Prior to Admission medications   Medication Sig Start Date End Date Taking? Authorizing Provider  albuterol (PROVENTIL HFA;VENTOLIN HFA) 108 (90 Base) MCG/ACT inhaler Inhale 2 puffs every 4 to 6 hours as needed for wheezing. 06/24/16  Yes [provider]  SPIRIVA RESPIMAT 2.5 MCG/ACT AERS Inhale 2 puffs into the lungs daily. 07/08/20  Yes [provider]  amLODipine (NORVASC) 5 MG tablet TAKE 1 TABLET EVERY DAY 12/19/18   [provider]  B Complex Vitamins (VITAMIN B-COMPLEX) TABS  Take by mouth.    [provider]  baclofen (LIORESAL) 10 MG tablet Take 10 mg by mouth at bedtime. 09/27/18   [provider]  budesonide-formoterol (SYMBICORT) 160-4.5 MCG/ACT inhaler Inhale 2 puffs into the lungs twice daily with spacer 03/02/19   Kozlow, Donnamarie Poag, MD  buPROPion (WELLBUTRIN XL) 150 MG 24 hr tablet Take by mouth. 01/16/19   [provider]  Calcium Carb-Cholecalciferol (SM OYSTER SHELL CALCIUM/VIT D3 PO) Take by mouth.    [provider]  fluticasone [facility]) 50 MCG/ACT nasal spray Use one spray in each nostril twice daily 01/03/19   Kozlow, Donnamarie Poag, MD  furosemide (LASIX) 20 MG tablet Take 20 mg by mouth daily as needed. 12/29/18   [provider]  gabapentin (NEURONTIN) 600 MG  tablet  11/06/18   [provider]  letrozole [facility]) 2.5 MG tablet Take by mouth. 07/14/18   [provider]  LORazepam (ATIVAN) 1 MG tablet Take 1 mg by mouth at bedtime. 12/16/18   [provider]  montelukast (SINGULAIR) 10 MG tablet  11/01/18   [provider]  omeprazole (PRILOSEC) 40 MG capsule Take one capsule twice daily as directed 01/03/19   Kozlow, Donnamarie Poag, MD  potassium chloride (MICRO-K) 10 MEQ CR capsule Take by mouth.    [provider]  QUEtiapine (SEROQUEL) 100 MG tablet TAKE 0.5 1 (ONE HALF TO ONE) TABLET BY MOUTH AT BEDTIME FOR MOOD STABILIZER INSOMNIA 12/12/18   [provider]  triamcinolone cream (KENALOG) 0.1 % APPLY CREAM EXTERNALLY TO AFFECTED AREA TWICE DAILY 10/26/18   [provider]  zolpidem (AMBIEN) 10 MG tablet TAKE 1 2 TO 1 (ONE HALF TO ONE) TABLET BY MOUTH EVERY DAY AT BEDTIME AS NEEDED FOR INSOMNIA 12/16/18   [provider]    Allergies    Pregabalin, Ace inhibitors, Hydromorphone, and Meperidine  Review of Systems   Review of Systems  Eyes: Negative for visual disturbance.  Respiratory: Positive for shortness of breath.   Cardiovascular: Positive for chest pain.  Gastrointestinal: Negative for abdominal pain, nausea and vomiting.  Musculoskeletal: Positive for arthralgias and myalgias.  Neurological: Negative for syncope.  All other systems reviewed and are negative.   Physical Exam Updated Vital Signs BP (!) 112/57   Pulse 85   Temp 97.8 F (36.6 C) (Oral)   Resp 17   Ht 5\' 3"  (1.6 m)   Wt 104.3 kg   SpO2 95%   BMI 40.74 kg/m   Physical Exam Vitals and nursing note reviewed.  Constitutional:      Appearance: She is well-developed.     Comments: c-collar in place  HENT:     Head: Normocephalic.     Comments: Large tender hematoma to left occipital scalp. Eyes:     Conjunctiva/sclera: Conjunctivae normal.  Neck:     Comments: No tenderness with palpation of the midline  c-spine or paraspinal musculature. Cardiovascular:     Rate and Rhythm: Normal rate and regular rhythm.  Pulmonary:     Effort: Pulmonary effort is normal.     Breath sounds: Normal breath sounds.     Comments: Symmetric chest expansion Chest:     Chest wall: Tenderness (tendernes to left anterior chest with light palpation. ) present.  Abdominal:     General: Bowel sounds are normal.     Palpations: Abdomen is soft.     Tenderness: There is no abdominal tenderness. There is no guarding or rebound.     Comments: No  bruising to the abdomen  Musculoskeletal:     Comments: There is bruising to the left lateral and anterior thigh with associated tenderness.  No pain with range of motion of the hip or knee.  No obvious deformity. Pelvis is stable. Left shoulder and wrist with tenderness, no obvious deformity.  There is some swelling present to the left wrist and pain with range of motion. No wounds to the wrist. Pt reports distal sensation to hand is normal   Skin:    General: Skin is warm.  Neurological:     Mental Status: She is alert.     Comments: Alert and oriented. Speech is fluent. EOM nl, PERRL. CN grossly intact.  Normal tone. Spontaneously moving all extremities without difficulty.  Psychiatric:        Behavior: Behavior normal.     ED Results / Procedures / Treatments   Labs (all labs ordered are listed, but only abnormal results are displayed) Labs Reviewed  COMPREHENSIVE METABOLIC PANEL - Abnormal; Notable for the following components:      Result Value   Glucose, Bld 123 (*)    BUN 24 (*)    Creatinine, Ser 1.04 (*)    Calcium 8.8 (*)    Total Protein 6.2 (*)    GFR calc non Af Amer 54 (*)    All other components within normal limits  CBC WITH DIFFERENTIAL/PLATELET - Abnormal; Notable for the following components:   WBC 14.4 (*)    Hemoglobin 11.7 (*)    Neutro Abs 11.0 (*)    Monocytes Absolute 1.2 (*)    Abs Immature Granulocytes 0.11 (*)    All other  components within normal limits  SARS CORONAVIRUS 2 BY RT PCR (HOSPITAL ORDER, [facility])    EKG None  Radiology DG Ribs Unilateral W/Chest Left  Result Date: 08/02/2020 CLINICAL DATA:  Fall with shortness of breath EXAM: LEFT RIBS AND CHEST - 3+ VIEW COMPARISON:  01/04/2019 FINDINGS: Single-view chest demonstrates mild cardiomegaly. No consolidation, pleural effusion or pneumothorax. Left rib series demonstrates acute displaced left third through seventh rib fractures. Minimal peripheral ground-glass opacity could reflect contusion. IMPRESSION: 1. Cardiomegaly without pleural effusion or pneumothorax 2. Acute displaced left third through seventh rib fractures. Minimal peripheral ground-glass opacity in the left thorax, possible contusion Electronically Signed   By: Donavan Foil M.D.   On: 08/02/2020 20:43   DG Wrist Complete Left  Result Date: 08/02/2020 CLINICAL DATA:  Fall with wrist pain EXAM: LEFT WRIST - COMPLETE 3+ VIEW COMPARISON:  None. FINDINGS: Acute nondisplaced mildly impacted intra-articular distal radius fracture. No subluxation. Positive for soft tissue swelling IMPRESSION: Acute nondisplaced intra-articular distal radius fracture. Electronically Signed   By: Donavan Foil M.D.   On: 08/02/2020 20:36   CT Head Wo Contrast  Result Date: 08/02/2020 CLINICAL DATA:  Status post trauma. EXAM: CT HEAD WITHOUT CONTRAST TECHNIQUE: Contiguous axial images were obtained from the base of the skull through the vertex without intravenous contrast. COMPARISON:  None. FINDINGS: Brain: No evidence of acute infarction, hemorrhage, hydrocephalus, extra-axial collection or mass lesion/mass effect. And should be noted that the cerebellum is markedly limited in evaluation secondary to extensive amount of overlying artifact. Vascular: No hyperdense vessel or unexpected calcification. Skull: Normal. Negative for fracture or focal lesion. Sinuses/Orbits: No acute finding.  Other: There is moderate severity posterior parietal scalp soft tissue swelling on the left. This extends toward the vertex. An associated 2.9 cm x 1.2 cm scalp hematoma is  noted. IMPRESSION: 1. Limited evaluation of the cerebellum secondary to overlying artifact. 2. Moderate severity posterior parietal scalp soft tissue swelling on the left with an associated 2.9 cm x 1.2 cm scalp hematoma. 3. No acute intracranial abnormality. Electronically Signed   By: Virgina Norfolk M.D.   On: 08/02/2020 20:30   CT Chest W Contrast  Result Date: 08/02/2020 CLINICAL DATA:  Status post trauma. EXAM: CT CHEST, ABDOMEN, AND PELVIS WITH CONTRAST TECHNIQUE: Multidetector CT imaging of the chest, abdomen and pelvis was performed following the standard protocol during bolus administration of intravenous contrast. CONTRAST:  17mL OMNIPAQUE IOHEXOL 300 MG/ML  SOLN COMPARISON:  None. FINDINGS: CT CHEST FINDINGS Cardiovascular: There is mild calcification of the aortic arch. Normal heart size. No pericardial effusion. Mediastinum/Nodes: No enlarged mediastinal, hilar, or axillary lymph nodes. Thyroid gland, trachea, and esophagus demonstrate no significant findings. Lungs/Pleura: Moderate severity atelectasis is seen along the posterior aspect of the bilateral lower lobes. A very small left pleural effusion is seen. A very small (approximately 3 mm) pneumothorax is seen along the anteromedial aspect of the left apex. An additional 5 mm pneumothorax is seen along the anterolateral aspect of the mid left lung. Musculoskeletal: Acute third, fourth, fifth, sixth and seventh left rib fractures are seen. CT ABDOMEN PELVIS FINDINGS Hepatobiliary: No focal liver abnormality is seen. No gallstones, gallbladder wall thickening, or biliary dilatation. Pancreas: Unremarkable. No pancreatic ductal dilatation or surrounding inflammatory changes. Spleen: Normal in size without focal abnormality. Adrenals/Urinary Tract: Adrenal glands are  unremarkable. The right kidney is normal in size and position. A pelvic left kidney is seen. There is no evidence of renal calculi, focal lesion, or hydronephrosis. Bladder is unremarkable. Stomach/Bowel: There is a small hiatal hernia. The appendix is not identified. No evidence of bowel wall thickening, distention, or inflammatory changes. Vascular/Lymphatic: There is moderate severity calcification of the abdominal aorta and bilateral common iliac arteries. No enlarged abdominal or pelvic lymph nodes. Reproductive: Status post hysterectomy. No adnexal masses. Other: No abdominal wall hernia or abnormality. No abdominopelvic ascites. Musculoskeletal: Multilevel degenerative changes seen throughout the lumbar spine. IMPRESSION: 1. Very small left apical and anterolateral mid left lung pneumothoraces. 2. Multiple acute left rib fractures. 3. Moderate severity posterior bilateral lower lobe atelectasis with a very small left pleural effusion. 4. Pelvic left kidney. 5. Small hiatal hernia. 6. Aortic atherosclerosis. Aortic Atherosclerosis (ICD10-I70.0). Electronically Signed   By: Virgina Norfolk M.D.   On: 08/02/2020 21:51   CT Cervical Spine Wo Contrast  Result Date: 08/02/2020 CLINICAL DATA:  Status post trauma. EXAM: CT CERVICAL SPINE WITHOUT CONTRAST TECHNIQUE: Multidetector CT imaging of the cervical spine was performed without intravenous contrast. Multiplanar CT image reconstructions were also generated. COMPARISON:  None. FINDINGS: Alignment: Normal. Skull base and vertebrae: No acute fracture. No primary bone lesion or focal pathologic process. Soft tissues and spinal canal: No prevertebral fluid or swelling. No visible canal hematoma. Disc levels: Moderate severity endplate sclerosis is seen at the levels of C5-C6 and C6-C7. Mild anterior osteophyte formation is seen at the level of C4-C5. Marked severity intervertebral disc space narrowing is seen at the levels of C5-C6 and C6-C7. Moderate severity  bilateral multilevel facet joint hypertrophy is noted. Upper chest: A very small anteromedial left apical pneumothorax is seen (approximately 4 mm). Other: A nondisplaced fourth left rib fracture is seen on the coronal views (coronal CT images 97 through 100, CT series number 9). IMPRESSION: 1. Very small anteromedial left apical pneumothorax. 2. Marked severity degenerative changes at  the levels of C5-C6 and C6-C7. 3. Nondisplaced fourth left rib fracture. 4. No acute fracture within the cervical spine. Electronically Signed   By: Virgina Norfolk M.D.   On: 08/02/2020 20:39   CT Abdomen Pelvis W Contrast  Result Date: 08/02/2020 CLINICAL DATA:  Status post trauma. EXAM: CT CHEST, ABDOMEN, AND PELVIS WITH CONTRAST TECHNIQUE: Multidetector CT imaging of the chest, abdomen and pelvis was performed following the standard protocol during bolus administration of intravenous contrast. CONTRAST:  17mL OMNIPAQUE IOHEXOL 300 MG/ML  SOLN COMPARISON:  None. FINDINGS: CT CHEST FINDINGS Cardiovascular: There is mild calcification of the aortic arch. Normal heart size. No pericardial effusion. Mediastinum/Nodes: No enlarged mediastinal, hilar, or axillary lymph nodes. Thyroid gland, trachea, and esophagus demonstrate no significant findings. Lungs/Pleura: Moderate severity atelectasis is seen along the posterior aspect of the bilateral lower lobes. A very small left pleural effusion is seen. A very small (approximately 3 mm) pneumothorax is seen along the anteromedial aspect of the left apex. An additional 5 mm pneumothorax is seen along the anterolateral aspect of the mid left lung. Musculoskeletal: Acute third, fourth, fifth, sixth and seventh left rib fractures are seen. CT ABDOMEN PELVIS FINDINGS Hepatobiliary: No focal liver abnormality is seen. No gallstones, gallbladder wall thickening, or biliary dilatation. Pancreas: Unremarkable. No pancreatic ductal dilatation or surrounding inflammatory changes. Spleen: Normal  in size without focal abnormality. Adrenals/Urinary Tract: Adrenal glands are unremarkable. The right kidney is normal in size and position. A pelvic left kidney is seen. There is no evidence of renal calculi, focal lesion, or hydronephrosis. Bladder is unremarkable. Stomach/Bowel: There is a small hiatal hernia. The appendix is not identified. No evidence of bowel wall thickening, distention, or inflammatory changes. Vascular/Lymphatic: There is moderate severity calcification of the abdominal aorta and bilateral common iliac arteries. No enlarged abdominal or pelvic lymph nodes. Reproductive: Status post hysterectomy. No adnexal masses. Other: No abdominal wall hernia or abnormality. No abdominopelvic ascites. Musculoskeletal: Multilevel degenerative changes seen throughout the lumbar spine. IMPRESSION: 1. Very small left apical and anterolateral mid left lung pneumothoraces. 2. Multiple acute left rib fractures. 3. Moderate severity posterior bilateral lower lobe atelectasis with a very small left pleural effusion. 4. Pelvic left kidney. 5. Small hiatal hernia. 6. Aortic atherosclerosis. Aortic Atherosclerosis (ICD10-I70.0). Electronically Signed   By: Virgina Norfolk M.D.   On: 08/02/2020 21:50   DG Shoulder Left  Result Date: 08/02/2020 CLINICAL DATA:  Fall with shoulder pain EXAM: LEFT SHOULDER - 2+ VIEW COMPARISON:  None. FINDINGS: mild AC joint degenerative change. Slight anterior positioning of the humeral head with respect to the glenoid fossa on Y-views suspect that this is largely related to positioning. Alignment appears within normal limits on other views. There are degenerative changes of the glenohumeral joint. IMPRESSION: Slight anterior positioning of the humeral head with respect to the glenoid fossa on the attempted Y-views, suspect that this is largely related to positioning. No definite acute osseous abnormality is seen Electronically Signed   By: Donavan Foil M.D.   On: 08/02/2020 20:39    DG Knee Complete 4 Views Left  Result Date: 08/02/2020 CLINICAL DATA:  Fall with knee pain EXAM: LEFT KNEE - COMPLETE 4+ VIEW COMPARISON:  None. FINDINGS: No fracture or malalignment. Status post left knee replacement with intact hardware. Small knee effusion. IMPRESSION: Status post left knee replacement. Small knee effusion. Electronically Signed   By: Donavan Foil M.D.   On: 08/02/2020 20:35   DG Hip Unilat With Pelvis 2-3 Views Left  Result  Date: 08/02/2020 CLINICAL DATA:  Fall EXAM: DG HIP (WITH OR WITHOUT PELVIS) 2-3V LEFT COMPARISON:  None. FINDINGS: SI joints are non widened. No acute displaced fracture or malalignment. Mild joint space narrowing. IMPRESSION: No acute osseous abnormality. Electronically Signed   By: Donavan Foil M.D.   On: 08/02/2020 20:34    Procedures Procedures (including critical care time)  Medications Ordered in ED Medications  albuterol (PROVENTIL) (2.5 MG/3ML) 0.083% nebulizer solution 3 mL (has no administration in time range)  amLODipine (NORVASC) tablet 5 mg (has no administration in time range)  baclofen (LIORESAL) tablet 10 mg (has no administration in time range)  mometasone-formoterol (DULERA) 200-5 MCG/ACT inhaler 2 puff (has no administration in time range)  buPROPion (WELLBUTRIN XL) 24 hr tablet 150 mg (has no administration in time range)  fluticasone (FLONASE) 50 MCG/ACT nasal spray 1 spray (has no administration in time range)  letrozole [facility]) tablet 2.5 mg (has no administration in time range)  LORazepam (ATIVAN) tablet 1 mg (has no administration in time range)  montelukast (SINGULAIR) tablet 10 mg (has no administration in time range)  pantoprazole (PROTONIX) EC tablet 40 mg (has no administration in time range)  umeclidinium bromide (INCRUSE ELLIPTA) 62.5 MCG/INH 1 puff (has no administration in time range)  zolpidem (AMBIEN) tablet 5 mg (has no administration in time range)  fentaNYL (SUBLIMAZE) injection 50 mcg (50 mcg Intravenous  Given 08/02/20 1923)  sodium chloride 0.9 % bolus 1,000 mL (1,000 mLs Intravenous New Bag/Given 08/02/20 2111)  fentaNYL (SUBLIMAZE) injection 75 mcg (75 mcg Intravenous Given 08/02/20 2111)  iohexol (OMNIPAQUE) 300 MG/ML solution 100 mL (100 mLs Intravenous Contrast Given 08/02/20 2120)    ED Course  I have reviewed the triage vital signs and the nursing notes.  Pertinent labs & imaging results that were available during my care of the patient were reviewed by me and considered in my medical decision making (see chart for details).    MDM Rules/Calculators/A&P                          Patient brought in by EMS after fall down a flight of stairs.  She hit her head she landed on the bottom however denies LOC.  She is not on anticoagulation.  She arrives in c-collar.  Complains of contusion to her head, left shoulder and wrist pain, significant pain to her left chest with pain with breathing and shortness of breath.  She also has bruising and pain to the left thigh.  She is alert and oriented.  Normal work of breathing, lung sounds are present bilaterally.  No abdominal tenderness or bruising.  Plain films and CT imaging is obtained.  Concern for rib fracture with possible pneumothorax given patient's symptoms.  Pain treated.  Basic labs.  CT head and C-spine are negative for closed head injury or c-spine injury. CT c-spine does however note small apical PTX and rib fx. CXR with fracture of ribs 3-7 on the left. CT chest/abd/pelvis ordered for further evaluation. Xray of wrist reveals nondisplaced intra-articular distal radius fracture. Short arm ordered - initially planned for sugar tong however pt IV is in the left [facility] and is the only functioning line, therefore will do short arm with dorsal and volar support for immobilization at this time - can be resplinted prior to hospital discharge as needed. Remainder of plain films are negative.  CT chest/abd with small left apical PTX and PTX in antero lateral  mid left lung.  Rib fx 3-7.  Pt will be admitted to trauma surgery service for management of PTX with multiple rib fx. Dr. Donne Hazel consulted and accepting admission.     Final Clinical Impression(s) / ED Diagnoses Final diagnoses:  Closed fracture of multiple ribs of left side, initial encounter  Traumatic pneumothorax, initial encounter  Other closed intra-articular fracture of distal end of left radius, initial encounter  Fall down stairs, initial encounter  Contusion of scalp, initial encounter    Rx / DC Orders ED Discharge Orders    None       Keala Drum, [place] N, PA-C 08/02/20 2318    Valarie Merino, MD 08/05/20 1110

## 2020-08-02 NOTE — ED Notes (Signed)
Pt transported to CT ?

## 2020-08-02 NOTE — ED Notes (Signed)
RN paged ortho tech.

## 2020-08-02 NOTE — H&P (Signed)
Brittany Berger is an 69 y.o. female.   Chief Complaint: fall down stairs HPI:  4 yof who fell 20 stairs down into a basement, hit head, no loc. Complains of left ribs, hip pain and wrist pain.  She was in house she had not been in and did not know there were stairs, no real history of falls in past.  She underwent evaluation and was found to have left rib fx with apical ptx on ct scan and I was called.   Past Medical History:  Diagnosis Date  . Allergic rhinitis   . Dry eye syndrome   . Epiretinal membrane (ERM) of right eye   . GERD (gastroesophageal reflux disease)   . Hyperlipidemia   . Hypertension   . Idiopathic peripheral neuropathy   . Insomnia   . Invasive ductal carcinoma of breast, female, left (Brittany Berger)   . Malignant neoplasm of lower-outer quadrant of left breast of female, estrogen receptor positive (Brittany Berger)   . Open angle with borderline findings and low glaucoma risk in both eyes   . Osteoarthritis   . Pseudophakia of both eyes   . PVD (posterior vitreous detachment), both eyes   . Recurrent major depressive disorder (Brittany Berger)   . Restless leg syndrome   . Retinal telangiectasis of left eye   . Sensorineural hearing loss (SNHL) of both ears   . Trochanteric bursitis of right hip     Past Surgical History:  Procedure Laterality Date  . BREAST LUMPECTOMY    . REPLACEMENT TOTAL KNEE BILATERAL    . ROTATOR CUFF REPAIR Bilateral     Family History  Problem Relation Age of Onset  . Emphysema Father   . Aneurysm Father   . Allergic rhinitis Sister   . Cancer Sister   . Allergic rhinitis Sister    Social History:  reports that she has never smoked. She has never used smokeless tobacco. No history on file for alcohol use and drug use.  Allergies:  Allergies  Allergen Reactions  . Pregabalin Shortness Of Breath  . Ace Inhibitors Other (See Comments)  . Hydromorphone Itching  . Meperidine Itching   meds reviewed  Results for orders placed or performed during the hospital  encounter of 08/02/20 (from the past 48 hour(s))  Comprehensive metabolic panel     Status: Abnormal   Collection Time: 08/02/20  7:22 PM  Result Value Ref Range   Sodium 139 135 - 145 mmol/L   Potassium 4.0 3.5 - 5.1 mmol/L   Chloride 104 98 - 111 mmol/L   CO2 25 22 - 32 mmol/L   Glucose, Bld 123 (H) 70 - 99 mg/dL    Comment: Glucose reference range applies only to samples taken after fasting for at least 8 hours.   BUN 24 (H) 8 - 23 mg/dL   Creatinine, Ser 1.04 (H) 0.44 - 1.00 mg/dL   Calcium 8.8 (L) 8.9 - 10.3 mg/dL   Total Protein 6.2 (L) 6.5 - 8.1 g/dL   Albumin 3.7 3.5 - 5.0 g/dL   AST 28 15 - 41 U/L   ALT 26 0 - 44 U/L   Alkaline Phosphatase 124 38 - 126 U/L   Total Bilirubin 0.3 0.3 - 1.2 mg/dL   GFR calc non Af Amer 54 (L) >60 mL/min   GFR calc Af Amer >60 >60 mL/min   Anion gap 10 5 - 15    Comment: Performed at [facility] [address] [place],  [address]  CBC with  Differential     Status: Abnormal   Collection Time: 08/02/20  7:22 PM  Result Value Ref Range   WBC 14.4 (H) 4.0 - 10.5 K/uL   RBC 4.08 3.87 - 5.11 MIL/uL   Hemoglobin 11.7 (L) 12.0 - 15.0 g/dL   HCT 37.6 36 - 46 %   MCV 92.2 80.0 - 100.0 fL   MCH 28.7 26.0 - 34.0 pg   MCHC 31.1 30.0 - 36.0 g/dL   RDW 13.4 11.5 - 15.5 %   Platelets 217 150 - 400 K/uL   nRBC 0.0 0.0 - 0.2 %   Neutrophils Relative % 77 %   Neutro Abs 11.0 (H) 1.7 - 7.7 K/uL   Lymphocytes Relative 13 %   Lymphs Abs 1.9 0.7 - 4.0 K/uL   Monocytes Relative 8 %   Monocytes Absolute 1.2 (H) 0 - 1 K/uL   Eosinophils Relative 1 %   Eosinophils Absolute 0.2 0 - 0 K/uL   Basophils Relative 0 %   Basophils Absolute 0.1 0 - 0 K/uL   Immature Granulocytes 1 %   Abs Immature Granulocytes 0.11 (H) 0.00 - 0.07 K/uL    Comment: Performed at [facility], [address] [place],  [address]   DG Ribs Unilateral W/Chest Left  Result Date: 08/02/2020 CLINICAL DATA:  Fall with shortness of breath EXAM: LEFT RIBS AND  CHEST - 3+ VIEW COMPARISON:  01/04/2019 FINDINGS: Single-view chest demonstrates mild cardiomegaly. No consolidation, pleural effusion or pneumothorax. Left rib series demonstrates acute displaced left third through seventh rib fractures. Minimal peripheral ground-glass opacity could reflect contusion. IMPRESSION: 1. Cardiomegaly without pleural effusion or pneumothorax 2. Acute displaced left third through seventh rib fractures. Minimal peripheral ground-glass opacity in the left thorax, possible contusion Electronically Signed   By: Donavan Foil M.D.   On: 08/02/2020 20:43   DG Wrist Complete Left  Result Date: 08/02/2020 CLINICAL DATA:  Fall with wrist pain EXAM: LEFT WRIST - COMPLETE 3+ VIEW COMPARISON:  None. FINDINGS: Acute nondisplaced mildly impacted intra-articular distal radius fracture. No subluxation. Positive for soft tissue swelling IMPRESSION: Acute nondisplaced intra-articular distal radius fracture. Electronically Signed   By: Donavan Foil M.D.   On: 08/02/2020 20:36   CT Head Wo Contrast  Result Date: 08/02/2020 CLINICAL DATA:  Status post trauma. EXAM: CT HEAD WITHOUT CONTRAST TECHNIQUE: Contiguous axial images were obtained from the base of the skull through the vertex without intravenous contrast. COMPARISON:  None. FINDINGS: Brain: No evidence of acute infarction, hemorrhage, hydrocephalus, extra-axial collection or mass lesion/mass effect. And should be noted that the cerebellum is markedly limited in evaluation secondary to extensive amount of overlying artifact. Vascular: No hyperdense vessel or unexpected calcification. Skull: Normal. Negative for fracture or focal lesion. Sinuses/Orbits: No acute finding. Other: There is moderate severity posterior parietal scalp soft tissue swelling on the left. This extends toward the vertex. An associated 2.9 cm x 1.2 cm scalp hematoma is noted. IMPRESSION: 1. Limited evaluation of the cerebellum secondary to overlying artifact. 2. Moderate  severity posterior parietal scalp soft tissue swelling on the left with an associated 2.9 cm x 1.2 cm scalp hematoma. 3. No acute intracranial abnormality. Electronically Signed   By: Virgina Norfolk M.D.   On: 08/02/2020 20:30   CT Cervical Spine Wo Contrast  Result Date: 08/02/2020 CLINICAL DATA:  Status post trauma. EXAM: CT CERVICAL SPINE WITHOUT CONTRAST TECHNIQUE: Multidetector CT imaging of the cervical spine was performed without intravenous contrast. Multiplanar CT image reconstructions were  also generated. COMPARISON:  None. FINDINGS: Alignment: Normal. Skull base and vertebrae: No acute fracture. No primary bone lesion or focal pathologic process. Soft tissues and spinal canal: No prevertebral fluid or swelling. No visible canal hematoma. Disc levels: Moderate severity endplate sclerosis is seen at the levels of C5-C6 and C6-C7. Mild anterior osteophyte formation is seen at the level of C4-C5. Marked severity intervertebral disc space narrowing is seen at the levels of C5-C6 and C6-C7. Moderate severity bilateral multilevel facet joint hypertrophy is noted. Upper chest: A very small anteromedial left apical pneumothorax is seen (approximately 4 mm). Other: A nondisplaced fourth left rib fracture is seen on the coronal views (coronal CT images 97 through 100, CT series number 9). IMPRESSION: 1. Very small anteromedial left apical pneumothorax. 2. Marked severity degenerative changes at the levels of C5-C6 and C6-C7. 3. Nondisplaced fourth left rib fracture. 4. No acute fracture within the cervical spine. Electronically Signed   By: Virgina Norfolk M.D.   On: 08/02/2020 20:39   DG Shoulder Left  Result Date: 08/02/2020 CLINICAL DATA:  Fall with shoulder pain EXAM: LEFT SHOULDER - 2+ VIEW COMPARISON:  None. FINDINGS: mild AC joint degenerative change. Slight anterior positioning of the humeral head with respect to the glenoid fossa on Y-views suspect that this is largely related to positioning.  Alignment appears within normal limits on other views. There are degenerative changes of the glenohumeral joint. IMPRESSION: Slight anterior positioning of the humeral head with respect to the glenoid fossa on the attempted Y-views, suspect that this is largely related to positioning. No definite acute osseous abnormality is seen Electronically Signed   By: Donavan Foil M.D.   On: 08/02/2020 20:39   DG Knee Complete 4 Views Left  Result Date: 08/02/2020 CLINICAL DATA:  Fall with knee pain EXAM: LEFT KNEE - COMPLETE 4+ VIEW COMPARISON:  None. FINDINGS: No fracture or malalignment. Status post left knee replacement with intact hardware. Small knee effusion. IMPRESSION: Status post left knee replacement. Small knee effusion. Electronically Signed   By: Donavan Foil M.D.   On: 08/02/2020 20:35   DG Hip Unilat With Pelvis 2-3 Views Left  Result Date: 08/02/2020 CLINICAL DATA:  Fall EXAM: DG HIP (WITH OR WITHOUT PELVIS) 2-3V LEFT COMPARISON:  None. FINDINGS: SI joints are non widened. No acute displaced fracture or malalignment. Mild joint space narrowing. IMPRESSION: No acute osseous abnormality. Electronically Signed   By: Donavan Foil M.D.   On: 08/02/2020 20:34    Review of Systems  Respiratory: Negative for shortness of breath.   Cardiovascular: Negative for chest pain.  Gastrointestinal: Negative for abdominal pain.  Musculoskeletal: Positive for arthralgias and myalgias.  All other systems reviewed and are negative.   Blood pressure 124/62, pulse 86, temperature 97.8 F (36.6 C), temperature source Oral, resp. rate 18, height 5\' 3"  (1.6 m), weight 104.3 kg, SpO2 95 %. Physical Exam Constitutional:      Appearance: Normal appearance. She is obese.  HENT:     Head: Normocephalic.     Comments: Occipital hematoma    Right Ear: External ear normal.     Left Ear: External ear normal.     Nose: Nose normal.     Mouth/Throat:     Mouth: Mucous membranes are dry.     Pharynx: Oropharynx  is clear.  Eyes:     General: No scleral icterus.    Extraocular Movements: Extraocular movements intact.     Pupils: Pupils are equal, round, and reactive to light.  Cardiovascular:     Rate and Rhythm: Normal rate and regular rhythm.     Pulses: Normal pulses.  Pulmonary:     Effort: Pulmonary effort is normal.     Breath sounds: Normal breath sounds.  Abdominal:     General: There is no distension.     Palpations: Abdomen is soft.     Tenderness: There is no abdominal tenderness.  Musculoskeletal:        General: No deformity.     Cervical back: Normal range of motion and neck supple. No tenderness.     Right lower leg: No edema.     Left lower leg: No edema.  Skin:    General: Skin is warm and dry.     Capillary Refill: Capillary refill takes less than 2 seconds.  Neurological:     General: No focal deficit present.     Mental Status: She is alert.  Psychiatric:        Mood and Affect: Mood normal.        Behavior: Behavior normal.      Assessment/Plan Fall down stairs Left radius fx- ortho consult from ER, splint Left 3-7 rib fx/ ptx on ct scan- pain control, pulm toilet, repeat cxr in am   Rolm Bookbinder, MD 08/02/2020, 9:28 PM

## 2020-08-02 NOTE — H&P (Signed)
Brittany Berger is an 69 y.o. female.   Chief Complaint: fall down stairs HPI:  31 yof who fell 20 stairs down into a basement, hit head, no loc. Complains of left ribs, hip pain and wrist pain.  She was in house she had not been in and did not know there were stairs, no real history of falls in past.  She underwent evaluation and was found to have left rib fx with apical ptx on ct scan and I was called.   Past Medical History:  Diagnosis Date   Allergic rhinitis    Dry eye syndrome    Epiretinal membrane (ERM) of right eye    GERD (gastroesophageal reflux disease)    Hyperlipidemia    Hypertension    Idiopathic peripheral neuropathy    Insomnia    Invasive ductal carcinoma of breast, female, left ([place])    Malignant neoplasm of lower-outer quadrant of left breast of female, estrogen receptor positive (HCC)    Open angle with borderline findings and low glaucoma risk in both eyes    Osteoarthritis    Pseudophakia of both eyes    PVD (posterior vitreous detachment), both eyes    Recurrent major depressive disorder (HCC)    Restless leg syndrome    Retinal telangiectasis of left eye    Sensorineural hearing loss (SNHL) of both ears    Trochanteric bursitis of right hip     Past Surgical History:  Procedure Laterality Date   BREAST LUMPECTOMY     REPLACEMENT TOTAL KNEE BILATERAL     ROTATOR CUFF REPAIR Bilateral     Family History  Problem Relation Age of Onset   Emphysema Father    Aneurysm Father    Allergic rhinitis Sister    Cancer Sister    Allergic rhinitis Sister    Social History:  reports that she has never smoked. She has never used smokeless tobacco. No history on file for alcohol use and drug use.  Allergies:  Allergies  Allergen Reactions   Pregabalin Shortness Of Breath   Ace Inhibitors Other (See Comments)   Hydromorphone Itching   Meperidine Itching   meds reviewed  Results for orders placed or performed during the hospital  encounter of 08/02/20 (from the past 48 hour(s))  Comprehensive metabolic panel     Status: Abnormal   Collection Time: 08/02/20  7:22 PM  Result Value Ref Range   Sodium 139 135 - 145 mmol/L   Potassium 4.0 3.5 - 5.1 mmol/L   Chloride 104 98 - 111 mmol/L   CO2 25 22 - 32 mmol/L   Glucose, Bld 123 (H) 70 - 99 mg/dL    Comment: Glucose reference range applies only to samples taken after fasting for at least 8 hours.   BUN 24 (H) 8 - 23 mg/dL   Creatinine, Ser 1.04 (H) 0.44 - 1.00 mg/dL   Calcium 8.8 (L) 8.9 - 10.3 mg/dL   Total Protein 6.2 (L) 6.5 - 8.1 g/dL   Albumin 3.7 3.5 - 5.0 g/dL   AST 28 15 - 41 U/L   ALT 26 0 - 44 U/L   Alkaline Phosphatase 124 38 - 126 U/L   Total Bilirubin 0.3 0.3 - 1.2 mg/dL   GFR calc non Af Amer 54 (L) >60 mL/min   GFR calc Af Amer >60 >60 mL/min   Anion gap 10 5 - 15    Comment: Performed at [facility] [address] [place], [place] [address]  CBC with  Differential     Status: Abnormal   Collection Time: 08/02/20  7:22 PM  Result Value Ref Range   WBC 14.4 (H) 4.0 - 10.5 K/uL   RBC 4.08 3.87 - 5.11 MIL/uL   Hemoglobin 11.7 (L) 12.0 - 15.0 g/dL   HCT 37.6 36 - 46 %   MCV 92.2 80.0 - 100.0 fL   MCH 28.7 26.0 - 34.0 pg   MCHC 31.1 30.0 - 36.0 g/dL   RDW 13.4 11.5 - 15.5 %   Platelets 217 150 - 400 K/uL   nRBC 0.0 0.0 - 0.2 %   Neutrophils Relative % 77 %   Neutro Abs 11.0 (H) 1.7 - 7.7 K/uL   Lymphocytes Relative 13 %   Lymphs Abs 1.9 0.7 - 4.0 K/uL   Monocytes Relative 8 %   Monocytes Absolute 1.2 (H) 0 - 1 K/uL   Eosinophils Relative 1 %   Eosinophils Absolute 0.2 0 - 0 K/uL   Basophils Relative 0 %   Basophils Absolute 0.1 0 - 0 K/uL   Immature Granulocytes 1 %   Abs Immature Granulocytes 0.11 (H) 0.00 - 0.07 K/uL    Comment: Performed at [facility], [address], [place], [place] [address]   DG Ribs Unilateral W/Chest Left  Result Date: 08/02/2020 CLINICAL DATA:  Fall with shortness of breath EXAM: LEFT RIBS AND  CHEST - 3+ VIEW COMPARISON:  01/04/2019 FINDINGS: Single-view chest demonstrates mild cardiomegaly. No consolidation, pleural effusion or pneumothorax. Left rib series demonstrates acute displaced left third through seventh rib fractures. Minimal peripheral ground-glass opacity could reflect contusion. IMPRESSION: 1. Cardiomegaly without pleural effusion or pneumothorax 2. Acute displaced left third through seventh rib fractures. Minimal peripheral ground-glass opacity in the left thorax, possible contusion Electronically Signed   By: Donavan Foil M.D.   On: 08/02/2020 20:43   DG Wrist Complete Left  Result Date: 08/02/2020 CLINICAL DATA:  Fall with wrist pain EXAM: LEFT WRIST - COMPLETE 3+ VIEW COMPARISON:  None. FINDINGS: Acute nondisplaced mildly impacted intra-articular distal radius fracture. No subluxation. Positive for soft tissue swelling IMPRESSION: Acute nondisplaced intra-articular distal radius fracture. Electronically Signed   By: Donavan Foil M.D.   On: 08/02/2020 20:36   CT Head Wo Contrast  Result Date: 08/02/2020 CLINICAL DATA:  Status post trauma. EXAM: CT HEAD WITHOUT CONTRAST TECHNIQUE: Contiguous axial images were obtained from the base of the skull through the vertex without intravenous contrast. COMPARISON:  None. FINDINGS: Brain: No evidence of acute infarction, hemorrhage, hydrocephalus, extra-axial collection or mass lesion/mass effect. And should be noted that the cerebellum is markedly limited in evaluation secondary to extensive amount of overlying artifact. Vascular: No hyperdense vessel or unexpected calcification. Skull: Normal. Negative for fracture or focal lesion. Sinuses/Orbits: No acute finding. Other: There is moderate severity posterior parietal scalp soft tissue swelling on the left. This extends toward the vertex. An associated 2.9 cm x 1.2 cm scalp hematoma is noted. IMPRESSION: 1. Limited evaluation of the cerebellum secondary to overlying artifact. 2. Moderate  severity posterior parietal scalp soft tissue swelling on the left with an associated 2.9 cm x 1.2 cm scalp hematoma. 3. No acute intracranial abnormality. Electronically Signed   By: Virgina Norfolk M.D.   On: 08/02/2020 20:30   CT Chest W Contrast  Result Date: 08/02/2020 CLINICAL DATA:  Status post trauma. EXAM: CT CHEST, ABDOMEN, AND PELVIS WITH CONTRAST TECHNIQUE: Multidetector CT imaging of the chest, abdomen and pelvis was performed following the standard protocol during  bolus administration of intravenous contrast. CONTRAST:  157mL OMNIPAQUE IOHEXOL 300 MG/ML  SOLN COMPARISON:  None. FINDINGS: CT CHEST FINDINGS Cardiovascular: There is mild calcification of the aortic arch. Normal heart size. No pericardial effusion. Mediastinum/Nodes: No enlarged mediastinal, hilar, or axillary lymph nodes. Thyroid gland, trachea, and esophagus demonstrate no significant findings. Lungs/Pleura: Moderate severity atelectasis is seen along the posterior aspect of the bilateral lower lobes. A very small left pleural effusion is seen. A very small (approximately 3 mm) pneumothorax is seen along the anteromedial aspect of the left apex. An additional 5 mm pneumothorax is seen along the anterolateral aspect of the mid left lung. Musculoskeletal: Acute third, fourth, fifth, sixth and seventh left rib fractures are seen. CT ABDOMEN PELVIS FINDINGS Hepatobiliary: No focal liver abnormality is seen. No gallstones, gallbladder wall thickening, or biliary dilatation. Pancreas: Unremarkable. No pancreatic ductal dilatation or surrounding inflammatory changes. Spleen: Normal in size without focal abnormality. Adrenals/Urinary Tract: Adrenal glands are unremarkable. The right kidney is normal in size and position. A pelvic left kidney is seen. There is no evidence of renal calculi, focal lesion, or hydronephrosis. Bladder is unremarkable. Stomach/Bowel: There is a small hiatal hernia. The appendix is not identified. No evidence of  bowel wall thickening, distention, or inflammatory changes. Vascular/Lymphatic: There is moderate severity calcification of the abdominal aorta and bilateral common iliac arteries. No enlarged abdominal or pelvic lymph nodes. Reproductive: Status post hysterectomy. No adnexal masses. Other: No abdominal wall hernia or abnormality. No abdominopelvic ascites. Musculoskeletal: Multilevel degenerative changes seen throughout the lumbar spine. IMPRESSION: 1. Very small left apical and anterolateral mid left lung pneumothoraces. 2. Multiple acute left rib fractures. 3. Moderate severity posterior bilateral lower lobe atelectasis with a very small left pleural effusion. 4. Pelvic left kidney. 5. Small hiatal hernia. 6. Aortic atherosclerosis. Aortic Atherosclerosis (ICD10-I70.0). Electronically Signed   By: Virgina Norfolk M.D.   On: 08/02/2020 21:51   CT Cervical Spine Wo Contrast  Result Date: 08/02/2020 CLINICAL DATA:  Status post trauma. EXAM: CT CERVICAL SPINE WITHOUT CONTRAST TECHNIQUE: Multidetector CT imaging of the cervical spine was performed without intravenous contrast. Multiplanar CT image reconstructions were also generated. COMPARISON:  None. FINDINGS: Alignment: Normal. Skull base and vertebrae: No acute fracture. No primary bone lesion or focal pathologic process. Soft tissues and spinal canal: No prevertebral fluid or swelling. No visible canal hematoma. Disc levels: Moderate severity endplate sclerosis is seen at the levels of C5-C6 and C6-C7. Mild anterior osteophyte formation is seen at the level of C4-C5. Marked severity intervertebral disc space narrowing is seen at the levels of C5-C6 and C6-C7. Moderate severity bilateral multilevel facet joint hypertrophy is noted. Upper chest: A very small anteromedial left apical pneumothorax is seen (approximately 4 mm). Other: A nondisplaced fourth left rib fracture is seen on the coronal views (coronal CT images 97 through 100, CT series number 9).  IMPRESSION: 1. Very small anteromedial left apical pneumothorax. 2. Marked severity degenerative changes at the levels of C5-C6 and C6-C7. 3. Nondisplaced fourth left rib fracture. 4. No acute fracture within the cervical spine. Electronically Signed   By: Virgina Norfolk M.D.   On: 08/02/2020 20:39   CT Abdomen Pelvis W Contrast  Result Date: 08/02/2020 CLINICAL DATA:  Status post trauma. EXAM: CT CHEST, ABDOMEN, AND PELVIS WITH CONTRAST TECHNIQUE: Multidetector CT imaging of the chest, abdomen and pelvis was performed following the standard protocol during bolus administration of intravenous contrast. CONTRAST:  156mL OMNIPAQUE IOHEXOL 300 MG/ML  SOLN COMPARISON:  None. FINDINGS:  CT CHEST FINDINGS Cardiovascular: There is mild calcification of the aortic arch. Normal heart size. No pericardial effusion. Mediastinum/Nodes: No enlarged mediastinal, hilar, or axillary lymph nodes. Thyroid gland, trachea, and esophagus demonstrate no significant findings. Lungs/Pleura: Moderate severity atelectasis is seen along the posterior aspect of the bilateral lower lobes. A very small left pleural effusion is seen. A very small (approximately 3 mm) pneumothorax is seen along the anteromedial aspect of the left apex. An additional 5 mm pneumothorax is seen along the anterolateral aspect of the mid left lung. Musculoskeletal: Acute third, fourth, fifth, sixth and seventh left rib fractures are seen. CT ABDOMEN PELVIS FINDINGS Hepatobiliary: No focal liver abnormality is seen. No gallstones, gallbladder wall thickening, or biliary dilatation. Pancreas: Unremarkable. No pancreatic ductal dilatation or surrounding inflammatory changes. Spleen: Normal in size without focal abnormality. Adrenals/Urinary Tract: Adrenal glands are unremarkable. The right kidney is normal in size and position. A pelvic left kidney is seen. There is no evidence of renal calculi, focal lesion, or hydronephrosis. Bladder is unremarkable.  Stomach/Bowel: There is a small hiatal hernia. The appendix is not identified. No evidence of bowel wall thickening, distention, or inflammatory changes. Vascular/Lymphatic: There is moderate severity calcification of the abdominal aorta and bilateral common iliac arteries. No enlarged abdominal or pelvic lymph nodes. Reproductive: Status post hysterectomy. No adnexal masses. Other: No abdominal wall hernia or abnormality. No abdominopelvic ascites. Musculoskeletal: Multilevel degenerative changes seen throughout the lumbar spine. IMPRESSION: 1. Very small left apical and anterolateral mid left lung pneumothoraces. 2. Multiple acute left rib fractures. 3. Moderate severity posterior bilateral lower lobe atelectasis with a very small left pleural effusion. 4. Pelvic left kidney. 5. Small hiatal hernia. 6. Aortic atherosclerosis. Aortic Atherosclerosis (ICD10-I70.0). Electronically Signed   By: Virgina Norfolk M.D.   On: 08/02/2020 21:50   DG Shoulder Left  Result Date: 08/02/2020 CLINICAL DATA:  Fall with shoulder pain EXAM: LEFT SHOULDER - 2+ VIEW COMPARISON:  None. FINDINGS: mild AC joint degenerative change. Slight anterior positioning of the humeral head with respect to the glenoid fossa on Y-views suspect that this is largely related to positioning. Alignment appears within normal limits on other views. There are degenerative changes of the glenohumeral joint. IMPRESSION: Slight anterior positioning of the humeral head with respect to the glenoid fossa on the attempted Y-views, suspect that this is largely related to positioning. No definite acute osseous abnormality is seen Electronically Signed   By: Donavan Foil M.D.   On: 08/02/2020 20:39   DG Knee Complete 4 Views Left  Result Date: 08/02/2020 CLINICAL DATA:  Fall with knee pain EXAM: LEFT KNEE - COMPLETE 4+ VIEW COMPARISON:  None. FINDINGS: No fracture or malalignment. Status post left knee replacement with intact hardware. Small knee effusion.  IMPRESSION: Status post left knee replacement. Small knee effusion. Electronically Signed   By: Donavan Foil M.D.   On: 08/02/2020 20:35   DG Hip Unilat With Pelvis 2-3 Views Left  Result Date: 08/02/2020 CLINICAL DATA:  Fall EXAM: DG HIP (WITH OR WITHOUT PELVIS) 2-3V LEFT COMPARISON:  None. FINDINGS: SI joints are non widened. No acute displaced fracture or malalignment. Mild joint space narrowing. IMPRESSION: No acute osseous abnormality. Electronically Signed   By: Donavan Foil M.D.   On: 08/02/2020 20:34    Review of Systems  Respiratory: Negative for shortness of breath.   Cardiovascular: Negative for chest pain.  Gastrointestinal: Negative for abdominal pain.  Musculoskeletal: Positive for arthralgias and myalgias.  All other systems reviewed and are negative.  Blood pressure 139/71, pulse 91, temperature 97.8 F (36.6 C), temperature source Oral, resp. rate 16, height 5\' 3"  (1.6 m), weight 104.3 kg, SpO2 95 %. Physical Exam Constitutional:      Appearance: Normal appearance. She is obese.  HENT:     Head: Normocephalic.     Comments: Occipital hematoma    Right Ear: External ear normal.     Left Ear: External ear normal.     Nose: Nose normal.     Mouth/Throat:     Mouth: Mucous membranes are dry.     Pharynx: Oropharynx is clear.  Eyes:     General: No scleral icterus.    Extraocular Movements: Extraocular movements intact.     Pupils: Pupils are equal, round, and reactive to light.  Cardiovascular:     Rate and Rhythm: Normal rate and regular rhythm.     Pulses: Normal pulses.  Pulmonary:     Effort: Pulmonary effort is normal.     Breath sounds: Normal breath sounds.  Abdominal:     General: There is no distension.     Palpations: Abdomen is soft.     Tenderness: There is no abdominal tenderness.  Musculoskeletal:        General: No deformity.     Cervical back: Normal range of motion and neck supple. No tenderness.     Right lower leg: No edema.     Left  lower leg: No edema.  Skin:    General: Skin is warm and dry.     Capillary Refill: Capillary refill takes less than 2 seconds.  Neurological:     General: No focal deficit present.     Mental Status: She is alert.  Psychiatric:        Mood and Affect: Mood normal.        Behavior: Behavior normal.      Assessment/Plan Fall down stairs Left radius fx- ortho consult from ER, splint Left 3-7 rib fx/ ptx on ct scan- pain control, pulm toilet, repeat cxr in am Home meds Lovenox, scds OSA   Rolm Bookbinder, MD 08/02/2020, 10:02 PM

## 2020-08-02 NOTE — ED Notes (Signed)
Pt transported to XRAY °

## 2020-08-03 ENCOUNTER — Observation Stay (HOSPITAL_COMMUNITY): Payer: Medicare Other

## 2020-08-03 DIAGNOSIS — M25562 Pain in left knee: Secondary | ICD-10-CM | POA: Diagnosis present

## 2020-08-03 DIAGNOSIS — M25552 Pain in left hip: Secondary | ICD-10-CM | POA: Diagnosis present

## 2020-08-03 DIAGNOSIS — Z96653 Presence of artificial knee joint, bilateral: Secondary | ICD-10-CM | POA: Diagnosis present

## 2020-08-03 DIAGNOSIS — W108XXA Fall (on) (from) other stairs and steps, initial encounter: Secondary | ICD-10-CM | POA: Diagnosis present

## 2020-08-03 DIAGNOSIS — S52572A Other intraarticular fracture of lower end of left radius, initial encounter for closed fracture: Secondary | ICD-10-CM | POA: Diagnosis present

## 2020-08-03 DIAGNOSIS — Z79899 Other long term (current) drug therapy: Secondary | ICD-10-CM | POA: Diagnosis not present

## 2020-08-03 DIAGNOSIS — M25532 Pain in left wrist: Secondary | ICD-10-CM | POA: Diagnosis present

## 2020-08-03 DIAGNOSIS — Z79811 Long term (current) use of aromatase inhibitors: Secondary | ICD-10-CM | POA: Diagnosis not present

## 2020-08-03 DIAGNOSIS — E785 Hyperlipidemia, unspecified: Secondary | ICD-10-CM | POA: Diagnosis present

## 2020-08-03 DIAGNOSIS — Z20822 Contact with and (suspected) exposure to covid-19: Secondary | ICD-10-CM | POA: Diagnosis present

## 2020-08-03 DIAGNOSIS — S0003XA Contusion of scalp, initial encounter: Secondary | ICD-10-CM | POA: Diagnosis present

## 2020-08-03 DIAGNOSIS — G2581 Restless legs syndrome: Secondary | ICD-10-CM | POA: Diagnosis present

## 2020-08-03 DIAGNOSIS — S2242XA Multiple fractures of ribs, left side, initial encounter for closed fracture: Secondary | ICD-10-CM | POA: Diagnosis present

## 2020-08-03 DIAGNOSIS — Y92018 Other place in single-family (private) house as the place of occurrence of the external cause: Secondary | ICD-10-CM | POA: Diagnosis not present

## 2020-08-03 DIAGNOSIS — S270XXA Traumatic pneumothorax, initial encounter: Secondary | ICD-10-CM | POA: Diagnosis present

## 2020-08-03 DIAGNOSIS — Z7951 Long term (current) use of inhaled steroids: Secondary | ICD-10-CM | POA: Diagnosis not present

## 2020-08-03 DIAGNOSIS — Z853 Personal history of malignant neoplasm of breast: Secondary | ICD-10-CM | POA: Diagnosis not present

## 2020-08-03 DIAGNOSIS — Z6841 Body Mass Index (BMI) 40.0 and over, adult: Secondary | ICD-10-CM | POA: Diagnosis not present

## 2020-08-03 DIAGNOSIS — I10 Essential (primary) hypertension: Secondary | ICD-10-CM | POA: Diagnosis present

## 2020-08-03 DIAGNOSIS — G609 Hereditary and idiopathic neuropathy, unspecified: Secondary | ICD-10-CM | POA: Diagnosis present

## 2020-08-03 DIAGNOSIS — Z170 Estrogen receptor positive status [ER+]: Secondary | ICD-10-CM | POA: Diagnosis not present

## 2020-08-03 DIAGNOSIS — D62 Acute posthemorrhagic anemia: Secondary | ICD-10-CM | POA: Diagnosis not present

## 2020-08-03 DIAGNOSIS — K219 Gastro-esophageal reflux disease without esophagitis: Secondary | ICD-10-CM | POA: Diagnosis present

## 2020-08-03 LAB — GLUCOSE, CAPILLARY
Glucose-Capillary: 133 mg/dL — ABNORMAL HIGH (ref 70–99)
Glucose-Capillary: 147 mg/dL — ABNORMAL HIGH (ref 70–99)

## 2020-08-03 MED ORDER — DULOXETINE HCL 60 MG PO CPEP
60.0000 mg | ORAL_CAPSULE | Freq: Every day | ORAL | Status: DC
  Administered 2020-08-04 – 2020-08-06 (×2): 60 mg via ORAL
  Filled 2020-08-03 (×3): qty 1

## 2020-08-03 MED ORDER — ONDANSETRON HCL 4 MG/2ML IJ SOLN: 4.0000 mg | Freq: Four times a day (QID) | INTRAMUSCULAR | Status: DC | PRN

## 2020-08-03 MED ORDER — METHOCARBAMOL 500 MG PO TABS
500.0000 mg | ORAL_TABLET | Freq: Three times a day (TID) | ORAL | Status: DC
  Administered 2020-08-03 – 2020-08-06 (×9): 500 mg via ORAL
  Filled 2020-08-03 (×9): qty 1

## 2020-08-03 MED ORDER — BISACODYL 10 MG RE SUPP: 10.0000 mg | Freq: Every day | RECTAL | Status: DC | PRN

## 2020-08-03 MED ORDER — DOCUSATE SODIUM 100 MG PO CAPS
100.0000 mg | ORAL_CAPSULE | Freq: Two times a day (BID) | ORAL | Status: DC
  Administered 2020-08-03 – 2020-08-06 (×6): 100 mg via ORAL
  Filled 2020-08-03 (×6): qty 1

## 2020-08-03 MED ORDER — MORPHINE SULFATE (PF) 2 MG/ML IV SOLN
1.0000 mg | INTRAVENOUS | Status: DC | PRN
  Administered 2020-08-03 (×2): 1 mg via INTRAVENOUS
  Filled 2020-08-03 (×3): qty 1

## 2020-08-03 MED ORDER — ENOXAPARIN SODIUM 30 MG/0.3ML ~~LOC~~ SOLN
30.0000 mg | Freq: Two times a day (BID) | SUBCUTANEOUS | Status: AC
  Administered 2020-08-03 – 2020-08-04 (×4): 30 mg via SUBCUTANEOUS
  Filled 2020-08-03 (×4): qty 0.3

## 2020-08-03 MED ORDER — ACETAMINOPHEN 325 MG PO TABS
650.0000 mg | ORAL_TABLET | Freq: Four times a day (QID) | ORAL | Status: DC
  Administered 2020-08-03 – 2020-08-04 (×6): 650 mg via ORAL
  Filled 2020-08-03 (×6): qty 2

## 2020-08-03 MED ORDER — SODIUM CHLORIDE 0.9 % IV SOLN: INTRAVENOUS | Status: DC

## 2020-08-03 MED ORDER — ONDANSETRON 4 MG PO TBDP: 4.0000 mg | ORAL_TABLET | Freq: Four times a day (QID) | ORAL | Status: DC | PRN

## 2020-08-03 MED ORDER — OXYCODONE HCL 5 MG PO TABS
5.0000 mg | ORAL_TABLET | ORAL | Status: DC | PRN
  Administered 2020-08-03 – 2020-08-04 (×5): 5 mg via ORAL
  Filled 2020-08-03 (×5): qty 1

## 2020-08-03 NOTE — Progress Notes (Signed)
Orthopedic Tech Progress Note Patient Details:  Brittany Berger [birth year] [id number]  Ortho Devices Type of Ortho Device: Volar splint Ortho Device/Splint Location: lue volar and dorsal applied instead of sugartong. pt had iv in the ac that couldnt be removed. dr approved. Ortho Device/Splint Interventions: Ordered, Application, Adjustment   Post Interventions Patient Tolerated: Well Instructions Provided: Care of device, Adjustment of device   Karolee Stamps 08/03/2020, 12:14 AM

## 2020-08-03 NOTE — Progress Notes (Addendum)
Subjective: CC: Notes left sided rib pain and left wrist pain. Has not gotten out of bed. No new areas of pain. Tolerating diet without n/v. Lives at home with her husband. Retired. Patient is R handed.   Objective: Vital signs in last 24 hours: Temp:  [97.8 F (36.6 C)-98.4 F (36.9 C)] 98.2 F (36.8 C) (08/29 1000) Pulse Rate:  [79-96] 96 (08/29 1000) Resp:  [15-22] 18 (08/29 1000) BP: (105-139)/(46-77) 109/46 (08/29 1000) SpO2:  [95 %-100 %] 97 % (08/29 1000) Weight:  [104.3 kg] 104.3 kg (08/28 1829) Last BM Date: 08/02/20  Intake/Output from previous day: No intake/output data recorded. Intake/Output this shift: No intake/output data recorded.  PE: Gen:  Alert, NAD, pleasant HEENT: EOM's intact, pupils equal and round Card:  RRR Pulm:  CTAB, no W/R/R, effort normal. 1000 on IS.  Abd: Soft, NT/ND, +BS Ext: L wrist in splint. Moves all digits. SILT to all digits. Left hip and knee with some tenderness and bruising. Able to perform rom. No tenderness over major joints otherwise.  Psych: A&Ox3  Skin: no rashes noted, warm and dry   Lab Results:  Recent Labs    08/02/20 1922  WBC 14.4*  HGB 11.7*  HCT 37.6  PLT 217   BMET Recent Labs    08/02/20 1922  NA 139  K 4.0  CL 104  CO2 25  GLUCOSE 123*  BUN 24*  CREATININE 1.04*  CALCIUM 8.8*   PT/INR No results for input(s): LABPROT, INR in the last 72 hours. CMP     Component Value Date/Time   NA 139 08/02/2020 1922   K 4.0 08/02/2020 1922   CL 104 08/02/2020 1922   CO2 25 08/02/2020 1922   GLUCOSE 123 (H) 08/02/2020 1922   BUN 24 (H) 08/02/2020 1922   CREATININE 1.04 (H) 08/02/2020 1922   CALCIUM 8.8 (L) 08/02/2020 1922   PROT 6.2 (L) 08/02/2020 1922   ALBUMIN 3.7 08/02/2020 1922   AST 28 08/02/2020 1922   ALT 26 08/02/2020 1922   ALKPHOS 124 08/02/2020 1922   BILITOT 0.3 08/02/2020 1922   GFRNONAA 54 (L) 08/02/2020 1922   GFRAA >60 08/02/2020 1922   Lipase  No results found for:  LIPASE     Studies/Results: DG Ribs Unilateral W/Chest Left  Result Date: 08/02/2020 CLINICAL DATA:  Fall with shortness of breath EXAM: LEFT RIBS AND CHEST - 3+ VIEW COMPARISON:  01/04/2019 FINDINGS: Single-view chest demonstrates mild cardiomegaly. No consolidation, pleural effusion or pneumothorax. Left rib series demonstrates acute displaced left third through seventh rib fractures. Minimal peripheral ground-glass opacity could reflect contusion. IMPRESSION: 1. Cardiomegaly without pleural effusion or pneumothorax 2. Acute displaced left third through seventh rib fractures. Minimal peripheral ground-glass opacity in the left thorax, possible contusion Electronically Signed   By: Donavan Foil M.D.   On: 08/02/2020 20:43   DG Wrist Complete Left  Result Date: 08/02/2020 CLINICAL DATA:  Fall with wrist pain EXAM: LEFT WRIST - COMPLETE 3+ VIEW COMPARISON:  None. FINDINGS: Acute nondisplaced mildly impacted intra-articular distal radius fracture. No subluxation. Positive for soft tissue swelling IMPRESSION: Acute nondisplaced intra-articular distal radius fracture. Electronically Signed   By: Donavan Foil M.D.   On: 08/02/2020 20:36   CT Head Wo Contrast  Result Date: 08/02/2020 CLINICAL DATA:  Status post trauma. EXAM: CT HEAD WITHOUT CONTRAST TECHNIQUE: Contiguous axial images were obtained from the base of the skull through the vertex without intravenous contrast. COMPARISON:  None. FINDINGS: Brain:  No evidence of acute infarction, hemorrhage, hydrocephalus, extra-axial collection or mass lesion/mass effect. And should be noted that the cerebellum is markedly limited in evaluation secondary to extensive amount of overlying artifact. Vascular: No hyperdense vessel or unexpected calcification. Skull: Normal. Negative for fracture or focal lesion. Sinuses/Orbits: No acute finding. Other: There is moderate severity posterior parietal scalp soft tissue swelling on the left. This extends toward the  vertex. An associated 2.9 cm x 1.2 cm scalp hematoma is noted. IMPRESSION: 1. Limited evaluation of the cerebellum secondary to overlying artifact. 2. Moderate severity posterior parietal scalp soft tissue swelling on the left with an associated 2.9 cm x 1.2 cm scalp hematoma. 3. No acute intracranial abnormality. Electronically Signed   By: Virgina Norfolk M.D.   On: 08/02/2020 20:30   CT Chest W Contrast  Result Date: 08/02/2020 CLINICAL DATA:  Status post trauma. EXAM: CT CHEST, ABDOMEN, AND PELVIS WITH CONTRAST TECHNIQUE: Multidetector CT imaging of the chest, abdomen and pelvis was performed following the standard protocol during bolus administration of intravenous contrast. CONTRAST:  171mL OMNIPAQUE IOHEXOL 300 MG/ML  SOLN COMPARISON:  None. FINDINGS: CT CHEST FINDINGS Cardiovascular: There is mild calcification of the aortic arch. Normal heart size. No pericardial effusion. Mediastinum/Nodes: No enlarged mediastinal, hilar, or axillary lymph nodes. Thyroid gland, trachea, and esophagus demonstrate no significant findings. Lungs/Pleura: Moderate severity atelectasis is seen along the posterior aspect of the bilateral lower lobes. A very small left pleural effusion is seen. A very small (approximately 3 mm) pneumothorax is seen along the anteromedial aspect of the left apex. An additional 5 mm pneumothorax is seen along the anterolateral aspect of the mid left lung. Musculoskeletal: Acute third, fourth, fifth, sixth and seventh left rib fractures are seen. CT ABDOMEN PELVIS FINDINGS Hepatobiliary: No focal liver abnormality is seen. No gallstones, gallbladder wall thickening, or biliary dilatation. Pancreas: Unremarkable. No pancreatic ductal dilatation or surrounding inflammatory changes. Spleen: Normal in size without focal abnormality. Adrenals/Urinary Tract: Adrenal glands are unremarkable. The right kidney is normal in size and position. A pelvic left kidney is seen. There is no evidence of renal  calculi, focal lesion, or hydronephrosis. Bladder is unremarkable. Stomach/Bowel: There is a small hiatal hernia. The appendix is not identified. No evidence of bowel wall thickening, distention, or inflammatory changes. Vascular/Lymphatic: There is moderate severity calcification of the abdominal aorta and bilateral common iliac arteries. No enlarged abdominal or pelvic lymph nodes. Reproductive: Status post hysterectomy. No adnexal masses. Other: No abdominal wall hernia or abnormality. No abdominopelvic ascites. Musculoskeletal: Multilevel degenerative changes seen throughout the lumbar spine. IMPRESSION: 1. Very small left apical and anterolateral mid left lung pneumothoraces. 2. Multiple acute left rib fractures. 3. Moderate severity posterior bilateral lower lobe atelectasis with a very small left pleural effusion. 4. Pelvic left kidney. 5. Small hiatal hernia. 6. Aortic atherosclerosis. Aortic Atherosclerosis (ICD10-I70.0). Electronically Signed   By: Virgina Norfolk M.D.   On: 08/02/2020 21:51   CT Cervical Spine Wo Contrast  Result Date: 08/02/2020 CLINICAL DATA:  Status post trauma. EXAM: CT CERVICAL SPINE WITHOUT CONTRAST TECHNIQUE: Multidetector CT imaging of the cervical spine was performed without intravenous contrast. Multiplanar CT image reconstructions were also generated. COMPARISON:  None. FINDINGS: Alignment: Normal. Skull base and vertebrae: No acute fracture. No primary bone lesion or focal pathologic process. Soft tissues and spinal canal: No prevertebral fluid or swelling. No visible canal hematoma. Disc levels: Moderate severity endplate sclerosis is seen at the levels of C5-C6 and C6-C7. Mild anterior osteophyte formation is seen  at the level of C4-C5. Marked severity intervertebral disc space narrowing is seen at the levels of C5-C6 and C6-C7. Moderate severity bilateral multilevel facet joint hypertrophy is noted. Upper chest: A very small anteromedial left apical pneumothorax is  seen (approximately 4 mm). Other: A nondisplaced fourth left rib fracture is seen on the coronal views (coronal CT images 97 through 100, CT series number 9). IMPRESSION: 1. Very small anteromedial left apical pneumothorax. 2. Marked severity degenerative changes at the levels of C5-C6 and C6-C7. 3. Nondisplaced fourth left rib fracture. 4. No acute fracture within the cervical spine. Electronically Signed   By: Virgina Norfolk M.D.   On: 08/02/2020 20:39   CT Abdomen Pelvis W Contrast  Result Date: 08/02/2020 CLINICAL DATA:  Status post trauma. EXAM: CT CHEST, ABDOMEN, AND PELVIS WITH CONTRAST TECHNIQUE: Multidetector CT imaging of the chest, abdomen and pelvis was performed following the standard protocol during bolus administration of intravenous contrast. CONTRAST:  193mL OMNIPAQUE IOHEXOL 300 MG/ML  SOLN COMPARISON:  None. FINDINGS: CT CHEST FINDINGS Cardiovascular: There is mild calcification of the aortic arch. Normal heart size. No pericardial effusion. Mediastinum/Nodes: No enlarged mediastinal, hilar, or axillary lymph nodes. Thyroid gland, trachea, and esophagus demonstrate no significant findings. Lungs/Pleura: Moderate severity atelectasis is seen along the posterior aspect of the bilateral lower lobes. A very small left pleural effusion is seen. A very small (approximately 3 mm) pneumothorax is seen along the anteromedial aspect of the left apex. An additional 5 mm pneumothorax is seen along the anterolateral aspect of the mid left lung. Musculoskeletal: Acute third, fourth, fifth, sixth and seventh left rib fractures are seen. CT ABDOMEN PELVIS FINDINGS Hepatobiliary: No focal liver abnormality is seen. No gallstones, gallbladder wall thickening, or biliary dilatation. Pancreas: Unremarkable. No pancreatic ductal dilatation or surrounding inflammatory changes. Spleen: Normal in size without focal abnormality. Adrenals/Urinary Tract: Adrenal glands are unremarkable. The right kidney is normal in  size and position. A pelvic left kidney is seen. There is no evidence of renal calculi, focal lesion, or hydronephrosis. Bladder is unremarkable. Stomach/Bowel: There is a small hiatal hernia. The appendix is not identified. No evidence of bowel wall thickening, distention, or inflammatory changes. Vascular/Lymphatic: There is moderate severity calcification of the abdominal aorta and bilateral common iliac arteries. No enlarged abdominal or pelvic lymph nodes. Reproductive: Status post hysterectomy. No adnexal masses. Other: No abdominal wall hernia or abnormality. No abdominopelvic ascites. Musculoskeletal: Multilevel degenerative changes seen throughout the lumbar spine. IMPRESSION: 1. Very small left apical and anterolateral mid left lung pneumothoraces. 2. Multiple acute left rib fractures. 3. Moderate severity posterior bilateral lower lobe atelectasis with a very small left pleural effusion. 4. Pelvic left kidney. 5. Small hiatal hernia. 6. Aortic atherosclerosis. Aortic Atherosclerosis (ICD10-I70.0). Electronically Signed   By: Virgina Norfolk M.D.   On: 08/02/2020 21:50   DG CHEST PORT 1 VIEW  Result Date: 08/03/2020 CLINICAL DATA:  Pneumothorax EXAM: PORTABLE CHEST 1 VIEW COMPARISON:  08/02/2020, CT chest 08/02/2020 FINDINGS: Tiny pneumothorax seen by CT is not well seen radiographically. There is cardiomegaly with prominent central pulmonary vessels. No pleural effusion. Multiple acute left-sided rib fractures. Patchy airspace disease at the left base. IMPRESSION: 1. Tiny left apical and anterolateral pneumothorax seen by CT is not well seen radiographically. 2. Cardiomegaly with prominent central pulmonary vessels. Minimal airspace disease at the left base Electronically Signed   By: Donavan Foil M.D.   On: 08/03/2020 02:03   DG Shoulder Left  Result Date: 08/02/2020 CLINICAL DATA:  Fall  with shoulder pain EXAM: LEFT SHOULDER - 2+ VIEW COMPARISON:  None. FINDINGS: mild AC joint degenerative  change. Slight anterior positioning of the humeral head with respect to the glenoid fossa on Y-views suspect that this is largely related to positioning. Alignment appears within normal limits on other views. There are degenerative changes of the glenohumeral joint. IMPRESSION: Slight anterior positioning of the humeral head with respect to the glenoid fossa on the attempted Y-views, suspect that this is largely related to positioning. No definite acute osseous abnormality is seen Electronically Signed   By: Donavan Foil M.D.   On: 08/02/2020 20:39   DG Knee Complete 4 Views Left  Result Date: 08/02/2020 CLINICAL DATA:  Fall with knee pain EXAM: LEFT KNEE - COMPLETE 4+ VIEW COMPARISON:  None. FINDINGS: No fracture or malalignment. Status post left knee replacement with intact hardware. Small knee effusion. IMPRESSION: Status post left knee replacement. Small knee effusion. Electronically Signed   By: Donavan Foil M.D.   On: 08/02/2020 20:35   DG Hip Unilat With Pelvis 2-3 Views Left  Result Date: 08/02/2020 CLINICAL DATA:  Fall EXAM: DG HIP (WITH OR WITHOUT PELVIS) 2-3V LEFT COMPARISON:  None. FINDINGS: SI joints are non widened. No acute displaced fracture or malalignment. Mild joint space narrowing. IMPRESSION: No acute osseous abnormality. Electronically Signed   By: Donavan Foil M.D.   On: 08/02/2020 20:34    Anti-infectives: Anti-infectives (From admission, onward)   None       Assessment/Plan Fall down stairs Left radius fx-  Discussed with Dr. Percell Miller. Splint. PT/OT Left 3-7 rib fx/ ptx on ct scan- multimodal pain control. Pulm toilet. CXR this am negative.  Left hip pain - negative plain films Left knee pain - Negative plain films  HTN HLD GERD  FEN - Reg VTE - SCDs, lovenox  ID - None    LOS: 0 days    Jillyn Ledger , [facility] 08/03/2020, 10:41 AM Please see Amion for pager number during day hours 7:00am-4:30pm

## 2020-08-03 NOTE — ED Notes (Signed)
First attempt to call report unsuccessful. 

## 2020-08-03 NOTE — Progress Notes (Signed)
PT Cancellation Note  Patient Details Name: Brittany Berger MRN: [id number] DOB: 1951-01-27   Cancelled Treatment:    Reason Eval/Treat Not Completed: Patient declined, no reason specified. Patient just moved a lot in the bed to get bed sheets changed. In a lot of pain. Will re-attempt a little later.    [name] 08/03/2020, 11:54 AM

## 2020-08-03 NOTE — H&P (Signed)
I have reviewed her films. Plan for now is splinting of her distal radius.   Formal consult to follow  [place]

## 2020-08-03 NOTE — Evaluation (Signed)
Occupational Therapy Evaluation Patient Details Name: Brittany Berger MRN: [id number] DOB: 1951/02/04 Today's Date: 08/03/2020    History of Present Illness 89 yof who fell 20 stairs down into a basement, hit head, no loc. Complains of left ribs, hip pain and wrist pain. L radius fx, L 3-7 rib fractures. Tiny L pnuemothorax.   Clinical Impression   This 69 y/o female presents with the above. PTA pt independent with ADL and functional mobility. Today pt requiring grossly minA (via HHA) for short distance mobility in room; requires overall modA for ADL tasks given pain, decreased standing balance and LUE deficits. SpO2 >92% on RA throughout. Pt living with spouse who was present during session however he reports plans for sx tomorrow (8/30, reports likely outpt sx), reports son also lives close by. Pt to  benefit from continued acute OT services, currently recommend follow up [place] services to maximize her safety and independence with ADL and mobility (pending pt has necessary/available assist at home at time of d/c).     Follow Up Recommendations  Home health OT;Supervision/Assistance - 24 hour    Equipment Recommendations  None recommended by OT           Precautions / Restrictions Precautions Precautions: Fall Required Braces or Orthoses: Splint/Cast Splint/Cast: L wrist splint/ace bandaged Restrictions Weight Bearing Restrictions: Yes Other Position/Activity Restrictions: no formal orders, but assumed NWB in LUE      Mobility Bed Mobility Overal bed mobility: Needs Assistance Bed Mobility: Supine to Sit     Supine to sit: Min assist;HOB elevated     General bed mobility comments: min assist to raise trunk to seated position. +dizziness initially   Transfers Overall transfer level: Needs assistance Equipment used: 1 person hand held assist;2 person hand held assist Transfers: Sit to/from Stand Sit to Stand: Min assist;+2 safety/equipment         General transfer comment:  L LE painful with WB    Balance Overall balance assessment: Needs assistance Sitting-balance support: Feet supported Sitting balance-Leahy Scale: Fair     Standing balance support: Single extremity supported;During functional activity Standing balance-Leahy Scale: Fair Standing balance comment: reliant on support at this time                           ADL either performed or assessed with clinical judgement   ADL Overall ADL's : Needs assistance/impaired Eating/Feeding: Set up;Sitting   Grooming: Set up;Sitting   Upper Body Bathing: Minimal assistance;Sitting   Lower Body Bathing: Moderate assistance;Sitting/lateral leans;Sit to/from stand   Upper Body Dressing : Minimal assistance;Sitting   Lower Body Dressing: Moderate assistance;Sit to/from stand Lower Body Dressing Details (indicate cue type and reason): assist to don socks today Toilet Transfer: Minimal assistance;+2 for safety/equipment;Ambulation Toilet Transfer Details (indicate cue type and reason): simulated via transfer to recliner, room level mobility  Toileting- Clothing Manipulation and Hygiene: Moderate assistance;Sit to/from stand       Functional mobility during ADLs: Minimal assistance;+2 for safety/equipment (HHA)                    Pertinent Vitals/Pain Pain Assessment: Faces Faces Pain Scale: Hurts whole lot Pain Location: L side (ribs, UE, LE) Pain Descriptors / Indicators: Aching;Grimacing;Guarding;Discomfort Pain Intervention(s): Limited activity within patient's tolerance;Monitored during session;Repositioned     Hand Dominance Right   Extremity/Trunk Assessment Upper Extremity Assessment Upper Extremity Assessment: LUE deficits/detail LUE Deficits / Details: L wrist/hand splinted/ace wrapped, able to minimally move digits  in flexion/extension, limited elbow/shoulder ROM given pain in UE  LUE: Unable to fully assess due to pain;Unable to fully assess due to  immobilization LUE Sensation: WNL (denies sensation changes ) LUE Coordination: decreased fine motor   Lower Extremity Assessment Lower Extremity Assessment: Defer to PT evaluation   Cervical / Trunk Assessment Cervical / Trunk Assessment: Normal   Communication Communication Communication: No difficulties   Cognition Arousal/Alertness: Awake/alert Behavior During Therapy: WFL for tasks assessed/performed Overall Cognitive Status: Within Functional Limits for tasks assessed                                     General Comments  spouse present during session     Exercises     Shoulder Instructions      Home Living Family/patient expects to be discharged to:: Private residence Living Arrangements: Spouse/significant other Available Help at Discharge: Family;Available PRN/intermittently Type of Home: House Home Access: Elevator     Home Layout: One level     Bathroom Shower/Tub: Teacher, early years/pre: Standard     Home Equipment: Environmental consultant - 4 wheels;Shower seat;Bedside commode   Additional Comments: spouse to have Surgery on 8/30 (should be outpatient) pt reports [facility] and they are working on getting a walk-in shower and tall toilets put in       Prior Functioning/Environment Level of Independence: Independent                 OT Problem List: Decreased strength;Decreased range of motion;Decreased activity tolerance;Impaired balance (sitting and/or standing);Decreased knowledge of use of DME or AE;Decreased knowledge of precautions;Obesity;Cardiopulmonary status limiting activity;Pain;Impaired UE functional use      OT Treatment/Interventions: Self-care/ADL training;Therapeutic exercise;Energy conservation;DME and/or AE instruction;Therapeutic activities;Patient/family education;Balance training    OT Goals(Current goals can be found in the care plan section) Acute Rehab OT Goals Patient Stated Goal: to return  home OT Goal Formulation: With patient Time For Goal Achievement: 08/17/20 Potential to Achieve Goals: Good  OT Frequency: Min 2X/week   Barriers to D/C:            Co-evaluation PT/OT/SLP Co-Evaluation/Treatment: Yes Reason for Co-Treatment: For patient/therapist safety;To address functional/ADL transfers PT goals addressed during session: Mobility/safety with mobility;Balance OT goals addressed during session: ADL's and self-care      AM-PAC OT "6 Clicks" Daily Activity     Outcome Measure Help from another person eating meals?: A Little Help from another person taking care of personal grooming?: A Little Help from another person toileting, which includes using toliet, bedpan, or urinal?: A Lot Help from another person bathing (including washing, rinsing, drying)?: A Lot Help from another person to put on and taking off regular upper body clothing?: A Little Help from another person to put on and taking off regular lower body clothing?: A Lot 6 Click Score: 15   End of Session Equipment Utilized During Treatment: Gait belt Nurse Communication: Mobility status  Activity Tolerance: Patient tolerated treatment well Patient left: in chair;with call bell/phone within reach;with family/visitor present  OT Visit Diagnosis: Other abnormalities of gait and mobility (R26.89);Pain Pain - Right/Left: Left Pain - part of body: Hand;Leg (ribcage)                Time: 7035-0093 OT Time Calculation (min): 24 min Charges:  OT General Charges $OT Visit: 1 Visit OT Evaluation $OT Eval Moderate Complexity: 1 Mod  Lou Cal,  OT Acute Rehabilitation Services Pager [phone] Office [phone]  Raymondo Band 08/03/2020, 4:32 PM

## 2020-08-03 NOTE — Evaluation (Signed)
Physical Therapy Evaluation Patient Details Name: Brittany Berger MRN: [id number] DOB: 02-11-1951 Today's Date: 08/03/2020   History of Present Illness  7 yof who fell 20 stairs down into a basement, hit head, no loc. Complains of left ribs, hip pain and wrist pain. L radius fx, L 3-7 rib fractures. Tiny L pnuemothorax.  Clinical Impression  Patient received in bed, HOH. Agrees to PT session. Had just spilled her drink on gown. Assisted patient to change gown. Patient requires min assist for supine to sit with HOB elevated. Feeling dizzy and nauseated when sitting up initially. Able to transfer sit to stand with min assist. Then ambulated to recliner, 15 feet, with single hand held assist. Patient will continue to benefit from skilled PT while here to improve functional mobility and independence to return home.     Follow Up Recommendations Home health PT;Supervision/Assistance - 24 hour    Equipment Recommendations  None recommended by PT    Recommendations for Other Services       Precautions / Restrictions Precautions Precautions: Fall Required Braces or Orthoses: Splint/Cast Splint/Cast: L wrist Restrictions Weight Bearing Restrictions: No Other Position/Activity Restrictions: No weight bearing orders, maintained L UE NWB during assessment.      Mobility  Bed Mobility Overal bed mobility: Needs Assistance Bed Mobility: Supine to Sit     Supine to sit: Min assist     General bed mobility comments: min assist to raise trunk to seated position.  Transfers Overall transfer level: Needs assistance Equipment used: 1 person hand held assist Transfers: Sit to/from Stand Sit to Stand: Min assist         General transfer comment: L LE painful with WB  Ambulation/Gait Ambulation/Gait assistance: Min assist Gait Distance (Feet): 15 Feet Assistive device: 1 person hand held assist Gait Pattern/deviations: Step-to pattern;Decreased stride length;Narrow base of  support;Shuffle Gait velocity: decreased   General Gait Details: able to ambulate around bed to recliner, relies on support for balance at this time.  Stairs            Wheelchair Mobility    Modified Rankin (Stroke Patients Only)       Balance Overall balance assessment: Needs assistance Sitting-balance support: Feet supported;Single extremity supported Sitting balance-Leahy Scale: Fair     Standing balance support: Single extremity supported;During functional activity Standing balance-Leahy Scale: Fair Standing balance comment: reliant on support at this time                             Pertinent Vitals/Pain Pain Assessment: Faces Faces Pain Scale: Hurts whole lot Pain Location: L side (ribs, UE, LE) Pain Descriptors / Indicators: Aching;Grimacing;Guarding;Discomfort Pain Intervention(s): Repositioned;Limited activity within patient's tolerance;Monitored during session    [place] expects to be discharged to:: Private residence Living Arrangements: Spouse/significant other Available Help at Discharge: Family;Available PRN/intermittently Type of Home: House Home Access: Elevator     Home Layout: One level Home Equipment: Walker - 4 wheels;Shower seat;Bedside commode Additional Comments: spouse to have Surgery on 8/30 (should be outpatient) pt reports husband is with the [facility] and they are working on getting a walk-in shower and tall toilets put in     Prior Function Level of Independence: Independent               Hand Dominance   Dominant Hand: Right    Extremity/Trunk Assessment   Upper Extremity Assessment Upper Extremity Assessment: Defer to OT evaluation    Lower  Extremity Assessment Lower Extremity Assessment: Generalized weakness    Cervical / Trunk Assessment Cervical / Trunk Assessment: Normal  Communication   Communication: No difficulties  Cognition Arousal/Alertness: Awake/alert Behavior During  Therapy: WFL for tasks assessed/performed Overall Cognitive Status: Within Functional Limits for tasks assessed                                        General Comments      Exercises     Assessment/Plan    PT Assessment Patient needs continued PT services  PT Problem List Decreased strength;Decreased mobility;Pain;Decreased activity tolerance;Decreased balance       PT Treatment Interventions DME instruction;Therapeutic activities;Gait training;Therapeutic exercise;Patient/family education;Balance training;Functional mobility training    PT Goals (Current goals can be found in the Care Plan section)  Acute Rehab PT Goals Patient Stated Goal: to return home PT Goal Formulation: With patient/family Time For Goal Achievement: 08/10/20 Potential to Achieve Goals: Good    Frequency Min 3X/week   Barriers to discharge        Co-evaluation PT/OT/SLP Co-Evaluation/Treatment: Yes Reason for Co-Treatment: For patient/therapist safety;To address functional/ADL transfers PT goals addressed during session: Mobility/safety with mobility;Balance         AM-PAC PT "6 Clicks" Mobility  Outcome Measure Help needed turning from your back to your side while in a flat bed without using bedrails?: A Little Help needed moving from lying on your back to sitting on the side of a flat bed without using bedrails?: A Little Help needed moving to and from a bed to a chair (including a wheelchair)?: A Little Help needed standing up from a chair using your arms (e.g., wheelchair or bedside chair)?: A Little Help needed to walk in hospital room?: A Little Help needed climbing 3-5 steps with a railing? : A Lot 6 Click Score: 17    End of Session Equipment Utilized During Treatment: Gait belt Activity Tolerance: Patient limited by pain Patient left: in chair;with call bell/phone within reach;with family/visitor present Nurse Communication: Mobility status PT Visit Diagnosis:  Pain;Difficulty in walking, not elsewhere classified (R26.2);Other abnormalities of gait and mobility (R26.89) Pain - Right/Left: Left Pain - part of body: Hand;Arm;Leg (Ribs)    Time: 4917-9150 PT Time Calculation (min) (ACUTE ONLY): 27 min   Charges:   PT Evaluation $PT Eval Moderate Complexity: 1 Mod          Graydon Fofana, PT, GCS 08/03/20,2:34 PM

## 2020-08-04 MED ORDER — ENOXAPARIN SODIUM 30 MG/0.3ML ~~LOC~~ SOLN
30.0000 mg | Freq: Two times a day (BID) | SUBCUTANEOUS | Status: DC
  Administered 2020-08-06 (×2): 30 mg via SUBCUTANEOUS
  Filled 2020-08-04: qty 0.3

## 2020-08-04 MED ORDER — ACETAMINOPHEN 325 MG PO TABS
325.0000 mg | ORAL_TABLET | ORAL | Status: DC
  Administered 2020-08-04 – 2020-08-06 (×11): 325 mg via ORAL
  Filled 2020-08-04 (×11): qty 1

## 2020-08-04 MED ORDER — HYDROCODONE-ACETAMINOPHEN 5-325 MG PO TABS
1.0000 | ORAL_TABLET | ORAL | Status: DC | PRN
  Administered 2020-08-04: 2 via ORAL
  Administered 2020-08-04: 1 via ORAL
  Administered 2020-08-04: 2 via ORAL
  Administered 2020-08-05 – 2020-08-06 (×5): 1 via ORAL
  Filled 2020-08-04 (×2): qty 1
  Filled 2020-08-04 (×5): qty 2
  Filled 2020-08-04 (×2): qty 1

## 2020-08-04 NOTE — Consult Note (Signed)
ORTHOPAEDIC CONSULTATION  REQUESTING PHYSICIAN: Rolm Bookbinder, MD  Chief Complaint: Left wrist fracture  HPI: Brittany Berger is a 69 y.o. female who complains of  Acute nondisplaced intra-articular distal radius fracture. Pt. States that the pain is tolerable. C/o more rib and left hip pain.   Past Medical History:  Diagnosis Date  . Allergic rhinitis   . Dry eye syndrome   . Epiretinal membrane (ERM) of right eye   . GERD (gastroesophageal reflux disease)   . Hyperlipidemia   . Hypertension   . Idiopathic peripheral neuropathy   . Insomnia   . Invasive ductal carcinoma of breast, female, left ([place])   . Malignant neoplasm of lower-outer quadrant of left breast of female, estrogen receptor positive ([place])   . Open angle with borderline findings and low glaucoma risk in both eyes   . Osteoarthritis   . Pseudophakia of both eyes   . PVD (posterior vitreous detachment), both eyes   . Recurrent major depressive disorder ([place])   . Restless leg syndrome   . Retinal telangiectasis of left eye   . Sensorineural hearing loss (SNHL) of both ears   . Trochanteric bursitis of right hip    Past Surgical History:  Procedure Laterality Date  . BREAST LUMPECTOMY    . REPLACEMENT TOTAL KNEE BILATERAL    . ROTATOR CUFF REPAIR Bilateral    Social History   Socioeconomic History  . Marital status: Married    Spouse name: Not on file  . Number of children: Not on file  . Years of education: Not on file  . Highest education level: Not on file  Occupational History  . Not on file  Tobacco Use  . Smoking status: Never Smoker  . Smokeless tobacco: Never Used  Substance and Sexual Activity  . Alcohol use: Not on file  . Drug use: Not on file  . Sexual activity: Not on file  Other Topics Concern  . Not on file  Social History Narrative  . Not on file   Social Determinants of Health   Financial Resource Strain:   . Difficulty of Paying Living Expenses: Not on file  Food  Insecurity:   . Worried About Charity fundraiser in the Last Year: Not on file  . Ran Out of Food in the Last Year: Not on file  Transportation Needs:   . Lack of Transportation (Medical): Not on file  . Lack of Transportation (Non-Medical): Not on file  Physical Activity:   . Days of Exercise per Week: Not on file  . Minutes of Exercise per Session: Not on file  Stress:   . Feeling of Stress : Not on file  Social Connections:   . Frequency of Communication with Friends and Family: Not on file  . Frequency of Social Gatherings with Friends and Family: Not on file  . Attends Religious Services: Not on file  . Active Member of Clubs or Organizations: Not on file  . Attends Archivist Meetings: Not on file  . Marital Status: Not on file   Family History  Problem Relation Age of Onset  . Emphysema Father   . Aneurysm Father   . Allergic rhinitis Sister   . Cancer Sister   . Allergic rhinitis Sister    Allergies  Allergen Reactions  . Pregabalin Shortness Of Breath  . Ace Inhibitors Other (See Comments)  . Hydromorphone Itching  . Meperidine Itching   Prior to Admission medications   Medication Sig  Start Date End Date Taking? Authorizing Provider  albuterol (PROVENTIL HFA;VENTOLIN HFA) 108 (90 Base) MCG/ACT inhaler Inhale 2 puffs every 4 to 6 hours as needed for wheezing. 06/24/16  Yes [provider]  atorvastatin (LIPITOR) 10 MG tablet Take 10 mg by mouth daily.   Yes [provider]  B Complex Vitamins (VITAMIN B-COMPLEX) TABS Take by mouth.   Yes [provider]  budesonide-formoterol (SYMBICORT) 160-4.5 MCG/ACT inhaler Inhale 2 puffs into the lungs twice daily with spacer 03/02/19  Yes Kozlow, Donnamarie Poag, MD  Calcium Carb-Cholecalciferol (SM OYSTER SHELL CALCIUM/VIT D3 PO) Take by mouth.   Yes [provider]  diclofenac (VOLTAREN) 75 MG EC tablet Take 75 mg by mouth 2 (two) times daily.   Yes [provider]  diltiazem  (CARDIZEM CD) 120 MG 24 hr capsule Take 120 mg by mouth daily.   Yes [provider]  DULoxetine (CYMBALTA) 60 MG capsule Take 60 mg by mouth daily.   Yes [provider]  exemestane (AROMASIN) 25 MG tablet Take 25 mg by mouth daily. 06/19/20  Yes [provider]  gabapentin (NEURONTIN) 600 MG tablet Take 600 mg by mouth 3 (three) times daily.  11/06/18  Yes [provider]  LORazepam (ATIVAN) 1 MG tablet Take 1 mg by mouth at bedtime as needed for anxiety.  12/16/18  Yes [provider]  montelukast (SINGULAIR) 10 MG tablet  11/01/18  Yes [provider]  omeprazole (PRILOSEC) 20 MG capsule Take 20 mg by mouth daily.   Yes [provider]  potassium chloride (MICRO-K) 10 MEQ CR capsule Take 10 mEq by mouth daily.    Yes [provider]  SPIRIVA RESPIMAT 2.5 MCG/ACT AERS Inhale 2 puffs into the lungs daily. 07/08/20  Yes [provider]  triamcinolone cream (KENALOG) 0.1 % Apply 1 application topically 2 (two) times daily.  10/26/18  Yes [provider]  triamterene-hydrochlorothiazide (DYAZIDE) 37.5-25 MG capsule Take 1 capsule by mouth daily.   Yes [provider]  zolpidem (AMBIEN) 10 MG tablet Take 10 mg by mouth at bedtime as needed for sleep.  12/16/18  Yes [provider]  amLODipine (NORVASC) 5 MG tablet TAKE 1 TABLET EVERY DAY Patient not taking: Reported on 08/03/2020 12/19/18   [provider]  baclofen (LIORESAL) 10 MG tablet Take 10 mg by mouth at bedtime. Patient not taking: Reported on 08/03/2020 09/27/18   [provider]  buPROPion (WELLBUTRIN XL) 150 MG 24 hr tablet Take by mouth. Patient not taking: Reported on 08/03/2020 01/16/19   [provider]  fluticasone Asencion Islam) 50 MCG/ACT nasal spray Use one spray in each nostril twice daily Patient not taking: Reported on 08/03/2020 01/03/19   Jiles Prows, MD  furosemide (LASIX) 20 MG tablet Take 20 mg by mouth  daily as needed. Patient not taking: Reported on 08/03/2020 12/29/18   [provider]  letrozole [facility]) 2.5 MG tablet Take by mouth. Patient not taking: Reported on 08/03/2020 07/14/18   [provider]  QUEtiapine (SEROQUEL) 100 MG tablet TAKE 0.5 1 (ONE HALF TO ONE) TABLET BY MOUTH AT BEDTIME FOR MOOD STABILIZER INSOMNIA Patient not taking: Reported on 08/03/2020 12/12/18   [provider]   DG Ribs Unilateral W/Chest Left  Result Date: 08/02/2020 CLINICAL DATA:  Fall with shortness of breath EXAM: LEFT RIBS AND CHEST - 3+ VIEW COMPARISON:  01/04/2019 FINDINGS: Single-view chest demonstrates mild cardiomegaly. No consolidation, pleural effusion or pneumothorax. Left rib series demonstrates acute displaced  left third through seventh rib fractures. Minimal peripheral ground-glass opacity could reflect contusion. IMPRESSION: 1. Cardiomegaly without pleural effusion or pneumothorax 2. Acute displaced left third through seventh rib fractures. Minimal peripheral ground-glass opacity in the left thorax, possible contusion Electronically Signed   By: Donavan Foil M.D.   On: 08/02/2020 20:43   DG Wrist Complete Left  Result Date: 08/02/2020 CLINICAL DATA:  Fall with wrist pain EXAM: LEFT WRIST - COMPLETE 3+ VIEW COMPARISON:  None. FINDINGS: Acute nondisplaced mildly impacted intra-articular distal radius fracture. No subluxation. Positive for soft tissue swelling IMPRESSION: Acute nondisplaced intra-articular distal radius fracture. Electronically Signed   By: Donavan Foil M.D.   On: 08/02/2020 20:36   CT Head Wo Contrast  Result Date: 08/02/2020 CLINICAL DATA:  Status post trauma. EXAM: CT HEAD WITHOUT CONTRAST TECHNIQUE: Contiguous axial images were obtained from the base of the skull through the vertex without intravenous contrast. COMPARISON:  None. FINDINGS: Brain: No evidence of acute infarction, hemorrhage, hydrocephalus, extra-axial collection or mass lesion/mass effect. And  should be noted that the cerebellum is markedly limited in evaluation secondary to extensive amount of overlying artifact. Vascular: No hyperdense vessel or unexpected calcification. Skull: Normal. Negative for fracture or focal lesion. Sinuses/Orbits: No acute finding. Other: There is moderate severity posterior parietal scalp soft tissue swelling on the left. This extends toward the vertex. An associated 2.9 cm x 1.2 cm scalp hematoma is noted. IMPRESSION: 1. Limited evaluation of the cerebellum secondary to overlying artifact. 2. Moderate severity posterior parietal scalp soft tissue swelling on the left with an associated 2.9 cm x 1.2 cm scalp hematoma. 3. No acute intracranial abnormality. Electronically Signed   By: Virgina Norfolk M.D.   On: 08/02/2020 20:30   CT Chest W Contrast  Result Date: 08/02/2020 CLINICAL DATA:  Status post trauma. EXAM: CT CHEST, ABDOMEN, AND PELVIS WITH CONTRAST TECHNIQUE: Multidetector CT imaging of the chest, abdomen and pelvis was performed following the standard protocol during bolus administration of intravenous contrast. CONTRAST:  187m OMNIPAQUE IOHEXOL 300 MG/ML  SOLN COMPARISON:  None. FINDINGS: CT CHEST FINDINGS Cardiovascular: There is mild calcification of the aortic arch. Normal heart size. No pericardial effusion. Mediastinum/Nodes: No enlarged mediastinal, hilar, or axillary lymph nodes. Thyroid gland, trachea, and esophagus demonstrate no significant findings. Lungs/Pleura: Moderate severity atelectasis is seen along the posterior aspect of the bilateral lower lobes. A very small left pleural effusion is seen. A very small (approximately 3 mm) pneumothorax is seen along the anteromedial aspect of the left apex. An additional 5 mm pneumothorax is seen along the anterolateral aspect of the mid left lung. Musculoskeletal: Acute third, fourth, fifth, sixth and seventh left rib fractures are seen. CT ABDOMEN PELVIS FINDINGS Hepatobiliary: No focal liver abnormality  is seen. No gallstones, gallbladder wall thickening, or biliary dilatation. Pancreas: Unremarkable. No pancreatic ductal dilatation or surrounding inflammatory changes. Spleen: Normal in size without focal abnormality. Adrenals/Urinary Tract: Adrenal glands are unremarkable. The right kidney is normal in size and position. A pelvic left kidney is seen. There is no evidence of renal calculi, focal lesion, or hydronephrosis. Bladder is unremarkable. Stomach/Bowel: There is a small hiatal hernia. The appendix is not identified. No evidence of bowel wall thickening, distention, or inflammatory changes. Vascular/Lymphatic: There is moderate severity calcification of the abdominal aorta and bilateral common iliac arteries. No enlarged abdominal or pelvic lymph nodes. Reproductive: Status post hysterectomy. No adnexal masses. Other: No abdominal wall hernia or abnormality. No abdominopelvic ascites. Musculoskeletal: Multilevel degenerative changes seen throughout the  lumbar spine. IMPRESSION: 1. Very small left apical and anterolateral mid left lung pneumothoraces. 2. Multiple acute left rib fractures. 3. Moderate severity posterior bilateral lower lobe atelectasis with a very small left pleural effusion. 4. Pelvic left kidney. 5. Small hiatal hernia. 6. Aortic atherosclerosis. Aortic Atherosclerosis (ICD10-I70.0). Electronically Signed   By: Virgina Norfolk M.D.   On: 08/02/2020 21:51   CT Cervical Spine Wo Contrast  Result Date: 08/02/2020 CLINICAL DATA:  Status post trauma. EXAM: CT CERVICAL SPINE WITHOUT CONTRAST TECHNIQUE: Multidetector CT imaging of the cervical spine was performed without intravenous contrast. Multiplanar CT image reconstructions were also generated. COMPARISON:  None. FINDINGS: Alignment: Normal. Skull base and vertebrae: No acute fracture. No primary bone lesion or focal pathologic process. Soft tissues and spinal canal: No prevertebral fluid or swelling. No visible canal hematoma. Disc  levels: Moderate severity endplate sclerosis is seen at the levels of C5-C6 and C6-C7. Mild anterior osteophyte formation is seen at the level of C4-C5. Marked severity intervertebral disc space narrowing is seen at the levels of C5-C6 and C6-C7. Moderate severity bilateral multilevel facet joint hypertrophy is noted. Upper chest: A very small anteromedial left apical pneumothorax is seen (approximately 4 mm). Other: A nondisplaced fourth left rib fracture is seen on the coronal views (coronal CT images 97 through 100, CT series number 9). IMPRESSION: 1. Very small anteromedial left apical pneumothorax. 2. Marked severity degenerative changes at the levels of C5-C6 and C6-C7. 3. Nondisplaced fourth left rib fracture. 4. No acute fracture within the cervical spine. Electronically Signed   By: Virgina Norfolk M.D.   On: 08/02/2020 20:39   CT Abdomen Pelvis W Contrast  Result Date: 08/02/2020 CLINICAL DATA:  Status post trauma. EXAM: CT CHEST, ABDOMEN, AND PELVIS WITH CONTRAST TECHNIQUE: Multidetector CT imaging of the chest, abdomen and pelvis was performed following the standard protocol during bolus administration of intravenous contrast. CONTRAST:  145m OMNIPAQUE IOHEXOL 300 MG/ML  SOLN COMPARISON:  None. FINDINGS: CT CHEST FINDINGS Cardiovascular: There is mild calcification of the aortic arch. Normal heart size. No pericardial effusion. Mediastinum/Nodes: No enlarged mediastinal, hilar, or axillary lymph nodes. Thyroid gland, trachea, and esophagus demonstrate no significant findings. Lungs/Pleura: Moderate severity atelectasis is seen along the posterior aspect of the bilateral lower lobes. A very small left pleural effusion is seen. A very small (approximately 3 mm) pneumothorax is seen along the anteromedial aspect of the left apex. An additional 5 mm pneumothorax is seen along the anterolateral aspect of the mid left lung. Musculoskeletal: Acute third, fourth, fifth, sixth and seventh left rib  fractures are seen. CT ABDOMEN PELVIS FINDINGS Hepatobiliary: No focal liver abnormality is seen. No gallstones, gallbladder wall thickening, or biliary dilatation. Pancreas: Unremarkable. No pancreatic ductal dilatation or surrounding inflammatory changes. Spleen: Normal in size without focal abnormality. Adrenals/Urinary Tract: Adrenal glands are unremarkable. The right kidney is normal in size and position. A pelvic left kidney is seen. There is no evidence of renal calculi, focal lesion, or hydronephrosis. Bladder is unremarkable. Stomach/Bowel: There is a small hiatal hernia. The appendix is not identified. No evidence of bowel wall thickening, distention, or inflammatory changes. Vascular/Lymphatic: There is moderate severity calcification of the abdominal aorta and bilateral common iliac arteries. No enlarged abdominal or pelvic lymph nodes. Reproductive: Status post hysterectomy. No adnexal masses. Other: No abdominal wall hernia or abnormality. No abdominopelvic ascites. Musculoskeletal: Multilevel degenerative changes seen throughout the lumbar spine. IMPRESSION: 1. Very small left apical and anterolateral mid left lung pneumothoraces. 2. Multiple acute left  rib fractures. 3. Moderate severity posterior bilateral lower lobe atelectasis with a very small left pleural effusion. 4. Pelvic left kidney. 5. Small hiatal hernia. 6. Aortic atherosclerosis. Aortic Atherosclerosis (ICD10-I70.0). Electronically Signed   By: Virgina Norfolk M.D.   On: 08/02/2020 21:50   DG CHEST PORT 1 VIEW  Result Date: 08/03/2020 CLINICAL DATA:  Pneumothorax EXAM: PORTABLE CHEST 1 VIEW COMPARISON:  08/02/2020, CT chest 08/02/2020 FINDINGS: Tiny pneumothorax seen by CT is not well seen radiographically. There is cardiomegaly with prominent central pulmonary vessels. No pleural effusion. Multiple acute left-sided rib fractures. Patchy airspace disease at the left base. IMPRESSION: 1. Tiny left apical and anterolateral  pneumothorax seen by CT is not well seen radiographically. 2. Cardiomegaly with prominent central pulmonary vessels. Minimal airspace disease at the left base Electronically Signed   By: Donavan Foil M.D.   On: 08/03/2020 02:03   DG Shoulder Left  Result Date: 08/02/2020 CLINICAL DATA:  Fall with shoulder pain EXAM: LEFT SHOULDER - 2+ VIEW COMPARISON:  None. FINDINGS: mild AC joint degenerative change. Slight anterior positioning of the humeral head with respect to the glenoid fossa on Y-views suspect that this is largely related to positioning. Alignment appears within normal limits on other views. There are degenerative changes of the glenohumeral joint. IMPRESSION: Slight anterior positioning of the humeral head with respect to the glenoid fossa on the attempted Y-views, suspect that this is largely related to positioning. No definite acute osseous abnormality is seen Electronically Signed   By: Donavan Foil M.D.   On: 08/02/2020 20:39   DG Knee Complete 4 Views Left  Result Date: 08/02/2020 CLINICAL DATA:  Fall with knee pain EXAM: LEFT KNEE - COMPLETE 4+ VIEW COMPARISON:  None. FINDINGS: No fracture or malalignment. Status post left knee replacement with intact hardware. Small knee effusion. IMPRESSION: Status post left knee replacement. Small knee effusion. Electronically Signed   By: Donavan Foil M.D.   On: 08/02/2020 20:35   DG Hip Unilat With Pelvis 2-3 Views Left  Result Date: 08/02/2020 CLINICAL DATA:  Fall EXAM: DG HIP (WITH OR WITHOUT PELVIS) 2-3V LEFT COMPARISON:  None. FINDINGS: SI joints are non widened. No acute displaced fracture or malalignment. Mild joint space narrowing. IMPRESSION: No acute osseous abnormality. Electronically Signed   By: Donavan Foil M.D.   On: 08/02/2020 20:34    Positive ROS: All other systems have been reviewed and were otherwise negative with the exception of those mentioned in the HPI and as above.  Labs cbc Recent Labs    08/02/20 1922  WBC 14.4*   HGB 11.7*  HCT 37.6  PLT 217    Labs inflam No results for input(s): CRP in the last 72 hours.  Invalid input(s): ESR  Labs coag No results for input(s): INR, PTT in the last 72 hours.  Invalid input(s): PT  Recent Labs    08/02/20 1922  NA 139  K 4.0  CL 104  CO2 25  GLUCOSE 123*  BUN 24*  CREATININE 1.04*  CALCIUM 8.8*    Physical Exam: Vitals:   08/03/20 2005 08/04/20 0426  BP: (!) 96/42 (!) 100/49  Pulse: 88 89  Resp: 18 18  Temp: 98.4 F (36.9 C) 98.1 F (36.7 C)  SpO2: 90% 91%   General: Alert, no acute distress, resting in bed  Cardiovascular: No pedal edema Respiratory: No cyanosis, no use of accessory musculature GI: No organomegaly, abdomen is soft and non-tender Skin: No lesions in the area of chief complaint other  than those listed below in MSK exam.  Neurologic: Sensation intact distally. Psychiatric: Patient is competent for consent with normal mood and affect Lymphatic: No axillary or cervical lymphadenopathy  MUSCULOSKELETAL:  LUE with splint present. NVI, good cap refill, sensation intact Other extremities are atraumatic with painless ROM and NVI.  Assessment: Acute nondisplaced intra-articular distal left radius fracture   Plan: Will plan for ORIF of the left radial fx tomorrow morning for better allignment and faster mobility. Weight Bearing Status: NWB to LUE PT VTE px: per trauma team. Please hold Lovenox tomorrow morning.  NPO after MN tonight.  Plan was discussed with pt. Who agrees.    Rachael Fee, PA-C    08/04/2020 7:58 AM

## 2020-08-04 NOTE — TOC CAGE-AID Note (Signed)
Transition of Care [facility]) - CAGE-AID Screening   Patient Details  Name: Brittany Berger MRN: [id number] Date of Birth: [birth year]  [facility] CM/SW Contact:    Emeterio Reeve, [place] Phone Number: 08/04/2020, 11:31 AM   Clinical Narrative:  CSW met with pt at bedside. CSW introduced self and explained her role at the hospital.  PT denies alcohol use and substance use. Pt did not need any resources at this time.    CAGE-AID Screening:    Have You Ever Felt You Ought to Cut Down on Your Drinking or Drug Use?: No Have People Annoyed You By Critizing Your Drinking Or Drug Use?: No Have You Felt Bad Or Guilty About Your Drinking Or Drug Use?: No Have You Ever Had a Drink or Used Drugs First Thing In The Morning to Steady Your Nerves or to Get Rid of a Hangover?: No CAGE-AID Score: 0  Substance Abuse Education Offered: Yes    Blima Ledger, [place] Social Worker [phone]

## 2020-08-04 NOTE — Progress Notes (Signed)
Transferred care to Sam, RN. Pt in no distress. 08/03/2020 @ [place], RN

## 2020-08-04 NOTE — Anesthesia Preprocedure Evaluation (Addendum)
Anesthesia Evaluation  Patient identified by MRN, date of birth, ID band Patient awake    Reviewed: Allergy & Precautions, NPO status , Patient's Chart, lab work & pertinent test results  Airway Mallampati: II  TM Distance: >3 FB Neck ROM: Full    Dental no notable dental hx. (+) Teeth Intact, Dental Advisory Given   Pulmonary neg pulmonary ROS,    Pulmonary exam normal breath sounds clear to auscultation       Cardiovascular hypertension, Pt. on medications Normal cardiovascular exam Rhythm:Regular Rate:Normal     Neuro/Psych negative psych ROS   GI/Hepatic Neg liver ROS, GERD  Medicated,  Endo/Other  Morbid obesity  Renal/GU negative Renal ROS     Musculoskeletal  (+) Arthritis ,   Abdominal   Peds  Hematology negative hematology ROS (+)   Anesthesia Other Findings   Reproductive/Obstetrics                            Anesthesia Physical Anesthesia Plan  ASA: II  Anesthesia Plan: Regional and MAC   Post-op Pain Management:    Induction:   PONV Risk Score and Plan: 4 or greater and Treatment may vary due to age or medical condition, Ondansetron, Dexamethasone and Midazolam  Airway Management Planned: Natural Airway  Additional Equipment: None  Intra-op Plan:   Post-operative Plan:   Informed Consent: I have reviewed the patients History and Physical, chart, labs and discussed the procedure including the risks, benefits and alternatives for the proposed anesthesia with the patient or authorized representative who has indicated his/her understanding and acceptance.     Dental advisory given  Plan Discussed with: CRNA  Anesthesia Plan Comments: (MAC W L supraclavicular)       Anesthesia Quick Evaluation

## 2020-08-04 NOTE — Progress Notes (Signed)
Subjective: Patient having a lot of pain from her ribs.  She is using her IS some.  She did get up with therapy yesterday to the chair for about 3 hrs.  C/o oxy making her itch.  Not eating great.  Had some nausea yesterday.  ROS: See above, otherwise other systems negative  Objective: Vital signs in last 24 hours: Temp:  [98.1 F (36.7 C)-98.6 F (37 C)] 98.1 F (36.7 C) (08/30 0426) Pulse Rate:  [87-96] 89 (08/30 0426) Resp:  [18] 18 (08/30 0426) BP: (94-109)/(42-49) 100/49 (08/30 0426) SpO2:  [90 %-97 %] 91 % (08/30 0426) Last BM Date: 08/02/20  Intake/Output from previous day: No intake/output data recorded. Intake/Output this shift: No intake/output data recorded.  PE: Gen:  Alert, NAD, pleasant HEENT: EOM's intact, pupils equal and round Card:  RRR Pulm:  CTAB, no W/R/R, effort normal. 1000 on IS. chest tenderness as expected. Abd: Soft, NT/ND, +BS Ext: L wrist in splint. Moves all digits. Psych: A&Ox3  Skin: no rashes noted, warm and dry  Lab Results:  Recent Labs    08/02/20 1922  WBC 14.4*  HGB 11.7*  HCT 37.6  PLT 217   BMET Recent Labs    08/02/20 1922  NA 139  K 4.0  CL 104  CO2 25  GLUCOSE 123*  BUN 24*  CREATININE 1.04*  CALCIUM 8.8*   PT/INR No results for input(s): LABPROT, INR in the last 72 hours. CMP     Component Value Date/Time   NA 139 08/02/2020 1922   K 4.0 08/02/2020 1922   CL 104 08/02/2020 1922   CO2 25 08/02/2020 1922   GLUCOSE 123 (H) 08/02/2020 1922   BUN 24 (H) 08/02/2020 1922   CREATININE 1.04 (H) 08/02/2020 1922   CALCIUM 8.8 (L) 08/02/2020 1922   PROT 6.2 (L) 08/02/2020 1922   ALBUMIN 3.7 08/02/2020 1922   AST 28 08/02/2020 1922   ALT 26 08/02/2020 1922   ALKPHOS 124 08/02/2020 1922   BILITOT 0.3 08/02/2020 1922   GFRNONAA 54 (L) 08/02/2020 1922   GFRAA >60 08/02/2020 1922   Lipase  No results found for: LIPASE     Studies/Results: DG Ribs Unilateral W/Chest Left  Result Date:  08/02/2020 CLINICAL DATA:  Fall with shortness of breath EXAM: LEFT RIBS AND CHEST - 3+ VIEW COMPARISON:  01/04/2019 FINDINGS: Single-view chest demonstrates mild cardiomegaly. No consolidation, pleural effusion or pneumothorax. Left rib series demonstrates acute displaced left third through seventh rib fractures. Minimal peripheral ground-glass opacity could reflect contusion. IMPRESSION: 1. Cardiomegaly without pleural effusion or pneumothorax 2. Acute displaced left third through seventh rib fractures. Minimal peripheral ground-glass opacity in the left thorax, possible contusion Electronically Signed   By: Donavan Foil M.D.   On: 08/02/2020 20:43   DG Wrist Complete Left  Result Date: 08/02/2020 CLINICAL DATA:  Fall with wrist pain EXAM: LEFT WRIST - COMPLETE 3+ VIEW COMPARISON:  None. FINDINGS: Acute nondisplaced mildly impacted intra-articular distal radius fracture. No subluxation. Positive for soft tissue swelling IMPRESSION: Acute nondisplaced intra-articular distal radius fracture. Electronically Signed   By: Donavan Foil M.D.   On: 08/02/2020 20:36   CT Head Wo Contrast  Result Date: 08/02/2020 CLINICAL DATA:  Status post trauma. EXAM: CT HEAD WITHOUT CONTRAST TECHNIQUE: Contiguous axial images were obtained from the base of the skull through the vertex without intravenous contrast. COMPARISON:  None. FINDINGS: Brain: No evidence of acute infarction, hemorrhage, hydrocephalus, extra-axial collection or mass lesion/mass effect.  And should be noted that the cerebellum is markedly limited in evaluation secondary to extensive amount of overlying artifact. Vascular: No hyperdense vessel or unexpected calcification. Skull: Normal. Negative for fracture or focal lesion. Sinuses/Orbits: No acute finding. Other: There is moderate severity posterior parietal scalp soft tissue swelling on the left. This extends toward the vertex. An associated 2.9 cm x 1.2 cm scalp hematoma is noted. IMPRESSION: 1. Limited  evaluation of the cerebellum secondary to overlying artifact. 2. Moderate severity posterior parietal scalp soft tissue swelling on the left with an associated 2.9 cm x 1.2 cm scalp hematoma. 3. No acute intracranial abnormality. Electronically Signed   By: Virgina Norfolk M.D.   On: 08/02/2020 20:30   CT Chest W Contrast  Result Date: 08/02/2020 CLINICAL DATA:  Status post trauma. EXAM: CT CHEST, ABDOMEN, AND PELVIS WITH CONTRAST TECHNIQUE: Multidetector CT imaging of the chest, abdomen and pelvis was performed following the standard protocol during bolus administration of intravenous contrast. CONTRAST:  164mL OMNIPAQUE IOHEXOL 300 MG/ML  SOLN COMPARISON:  None. FINDINGS: CT CHEST FINDINGS Cardiovascular: There is mild calcification of the aortic arch. Normal heart size. No pericardial effusion. Mediastinum/Nodes: No enlarged mediastinal, hilar, or axillary lymph nodes. Thyroid gland, trachea, and esophagus demonstrate no significant findings. Lungs/Pleura: Moderate severity atelectasis is seen along the posterior aspect of the bilateral lower lobes. A very small left pleural effusion is seen. A very small (approximately 3 mm) pneumothorax is seen along the anteromedial aspect of the left apex. An additional 5 mm pneumothorax is seen along the anterolateral aspect of the mid left lung. Musculoskeletal: Acute third, fourth, fifth, sixth and seventh left rib fractures are seen. CT ABDOMEN PELVIS FINDINGS Hepatobiliary: No focal liver abnormality is seen. No gallstones, gallbladder wall thickening, or biliary dilatation. Pancreas: Unremarkable. No pancreatic ductal dilatation or surrounding inflammatory changes. Spleen: Normal in size without focal abnormality. Adrenals/Urinary Tract: Adrenal glands are unremarkable. The right kidney is normal in size and position. A pelvic left kidney is seen. There is no evidence of renal calculi, focal lesion, or hydronephrosis. Bladder is unremarkable. Stomach/Bowel: There  is a small hiatal hernia. The appendix is not identified. No evidence of bowel wall thickening, distention, or inflammatory changes. Vascular/Lymphatic: There is moderate severity calcification of the abdominal aorta and bilateral common iliac arteries. No enlarged abdominal or pelvic lymph nodes. Reproductive: Status post hysterectomy. No adnexal masses. Other: No abdominal wall hernia or abnormality. No abdominopelvic ascites. Musculoskeletal: Multilevel degenerative changes seen throughout the lumbar spine. IMPRESSION: 1. Very small left apical and anterolateral mid left lung pneumothoraces. 2. Multiple acute left rib fractures. 3. Moderate severity posterior bilateral lower lobe atelectasis with a very small left pleural effusion. 4. Pelvic left kidney. 5. Small hiatal hernia. 6. Aortic atherosclerosis. Aortic Atherosclerosis (ICD10-I70.0). Electronically Signed   By: Virgina Norfolk M.D.   On: 08/02/2020 21:51   CT Cervical Spine Wo Contrast  Result Date: 08/02/2020 CLINICAL DATA:  Status post trauma. EXAM: CT CERVICAL SPINE WITHOUT CONTRAST TECHNIQUE: Multidetector CT imaging of the cervical spine was performed without intravenous contrast. Multiplanar CT image reconstructions were also generated. COMPARISON:  None. FINDINGS: Alignment: Normal. Skull base and vertebrae: No acute fracture. No primary bone lesion or focal pathologic process. Soft tissues and spinal canal: No prevertebral fluid or swelling. No visible canal hematoma. Disc levels: Moderate severity endplate sclerosis is seen at the levels of C5-C6 and C6-C7. Mild anterior osteophyte formation is seen at the level of C4-C5. Marked severity intervertebral disc space narrowing is seen  at the levels of C5-C6 and C6-C7. Moderate severity bilateral multilevel facet joint hypertrophy is noted. Upper chest: A very small anteromedial left apical pneumothorax is seen (approximately 4 mm). Other: A nondisplaced fourth left rib fracture is seen on the  coronal views (coronal CT images 97 through 100, CT series number 9). IMPRESSION: 1. Very small anteromedial left apical pneumothorax. 2. Marked severity degenerative changes at the levels of C5-C6 and C6-C7. 3. Nondisplaced fourth left rib fracture. 4. No acute fracture within the cervical spine. Electronically Signed   By: Virgina Norfolk M.D.   On: 08/02/2020 20:39   CT Abdomen Pelvis W Contrast  Result Date: 08/02/2020 CLINICAL DATA:  Status post trauma. EXAM: CT CHEST, ABDOMEN, AND PELVIS WITH CONTRAST TECHNIQUE: Multidetector CT imaging of the chest, abdomen and pelvis was performed following the standard protocol during bolus administration of intravenous contrast. CONTRAST:  170mL OMNIPAQUE IOHEXOL 300 MG/ML  SOLN COMPARISON:  None. FINDINGS: CT CHEST FINDINGS Cardiovascular: There is mild calcification of the aortic arch. Normal heart size. No pericardial effusion. Mediastinum/Nodes: No enlarged mediastinal, hilar, or axillary lymph nodes. Thyroid gland, trachea, and esophagus demonstrate no significant findings. Lungs/Pleura: Moderate severity atelectasis is seen along the posterior aspect of the bilateral lower lobes. A very small left pleural effusion is seen. A very small (approximately 3 mm) pneumothorax is seen along the anteromedial aspect of the left apex. An additional 5 mm pneumothorax is seen along the anterolateral aspect of the mid left lung. Musculoskeletal: Acute third, fourth, fifth, sixth and seventh left rib fractures are seen. CT ABDOMEN PELVIS FINDINGS Hepatobiliary: No focal liver abnormality is seen. No gallstones, gallbladder wall thickening, or biliary dilatation. Pancreas: Unremarkable. No pancreatic ductal dilatation or surrounding inflammatory changes. Spleen: Normal in size without focal abnormality. Adrenals/Urinary Tract: Adrenal glands are unremarkable. The right kidney is normal in size and position. A pelvic left kidney is seen. There is no evidence of renal calculi,  focal lesion, or hydronephrosis. Bladder is unremarkable. Stomach/Bowel: There is a small hiatal hernia. The appendix is not identified. No evidence of bowel wall thickening, distention, or inflammatory changes. Vascular/Lymphatic: There is moderate severity calcification of the abdominal aorta and bilateral common iliac arteries. No enlarged abdominal or pelvic lymph nodes. Reproductive: Status post hysterectomy. No adnexal masses. Other: No abdominal wall hernia or abnormality. No abdominopelvic ascites. Musculoskeletal: Multilevel degenerative changes seen throughout the lumbar spine. IMPRESSION: 1. Very small left apical and anterolateral mid left lung pneumothoraces. 2. Multiple acute left rib fractures. 3. Moderate severity posterior bilateral lower lobe atelectasis with a very small left pleural effusion. 4. Pelvic left kidney. 5. Small hiatal hernia. 6. Aortic atherosclerosis. Aortic Atherosclerosis (ICD10-I70.0). Electronically Signed   By: Virgina Norfolk M.D.   On: 08/02/2020 21:50   DG CHEST PORT 1 VIEW  Result Date: 08/03/2020 CLINICAL DATA:  Pneumothorax EXAM: PORTABLE CHEST 1 VIEW COMPARISON:  08/02/2020, CT chest 08/02/2020 FINDINGS: Tiny pneumothorax seen by CT is not well seen radiographically. There is cardiomegaly with prominent central pulmonary vessels. No pleural effusion. Multiple acute left-sided rib fractures. Patchy airspace disease at the left base. IMPRESSION: 1. Tiny left apical and anterolateral pneumothorax seen by CT is not well seen radiographically. 2. Cardiomegaly with prominent central pulmonary vessels. Minimal airspace disease at the left base Electronically Signed   By: Donavan Foil M.D.   On: 08/03/2020 02:03   DG Shoulder Left  Result Date: 08/02/2020 CLINICAL DATA:  Fall with shoulder pain EXAM: LEFT SHOULDER - 2+ VIEW COMPARISON:  None. FINDINGS:  mild AC joint degenerative change. Slight anterior positioning of the humeral head with respect to the glenoid fossa  on Y-views suspect that this is largely related to positioning. Alignment appears within normal limits on other views. There are degenerative changes of the glenohumeral joint. IMPRESSION: Slight anterior positioning of the humeral head with respect to the glenoid fossa on the attempted Y-views, suspect that this is largely related to positioning. No definite acute osseous abnormality is seen Electronically Signed   By: Donavan Foil M.D.   On: 08/02/2020 20:39   DG Knee Complete 4 Views Left  Result Date: 08/02/2020 CLINICAL DATA:  Fall with knee pain EXAM: LEFT KNEE - COMPLETE 4+ VIEW COMPARISON:  None. FINDINGS: No fracture or malalignment. Status post left knee replacement with intact hardware. Small knee effusion. IMPRESSION: Status post left knee replacement. Small knee effusion. Electronically Signed   By: Donavan Foil M.D.   On: 08/02/2020 20:35   DG Hip Unilat With Pelvis 2-3 Views Left  Result Date: 08/02/2020 CLINICAL DATA:  Fall EXAM: DG HIP (WITH OR WITHOUT PELVIS) 2-3V LEFT COMPARISON:  None. FINDINGS: SI joints are non widened. No acute displaced fracture or malalignment. Mild joint space narrowing. IMPRESSION: No acute osseous abnormality. Electronically Signed   By: Donavan Foil M.D.   On: 08/02/2020 20:34    Anti-infectives: Anti-infectives (From admission, onward)   None       Assessment/Plan Fall down stairs Left radius fx-  to OR tomorrow with Dr. Percell Miller for fixation. Left 3-7 rib fx/ ptx on ct scan- multimodal pain control. Pulm toilet. CXR negative  Left hip pain - negative plain films Left knee pain - Negative plain films  HTN HLD GERD FEN - Reg, NPO p MN for OR tomorrow with Ortho VTE - SCDs, lovenox  ID - None Dispo - CIR consulted   LOS: 1 day    Henreitta Cea , [facility] 08/04/2020, 8:42 AM Please see Amion for pager number during day hours 7:00am-4:30pm or 7:00am -11:30am on weekends

## 2020-08-04 NOTE — H&P (View-Only) (Signed)
ORTHOPAEDIC CONSULTATION  REQUESTING PHYSICIAN: Rolm Bookbinder, MD  Chief Complaint: Left wrist fracture  HPI: Brittany Berger is a 69 y.o. female who complains of  Acute nondisplaced intra-articular distal radius fracture. Pt. States that the pain is tolerable. C/o more rib and left hip pain.   Past Medical History:  Diagnosis Date  . Allergic rhinitis   . Dry eye syndrome   . Epiretinal membrane (ERM) of right eye   . GERD (gastroesophageal reflux disease)   . Hyperlipidemia   . Hypertension   . Idiopathic peripheral neuropathy   . Insomnia   . Invasive ductal carcinoma of breast, female, left ([place])   . Malignant neoplasm of lower-outer quadrant of left breast of female, estrogen receptor positive ([place])   . Open angle with borderline findings and low glaucoma risk in both eyes   . Osteoarthritis   . Pseudophakia of both eyes   . PVD (posterior vitreous detachment), both eyes   . Recurrent major depressive disorder ([place])   . Restless leg syndrome   . Retinal telangiectasis of left eye   . Sensorineural hearing loss (SNHL) of both ears   . Trochanteric bursitis of right hip    Past Surgical History:  Procedure Laterality Date  . BREAST LUMPECTOMY    . REPLACEMENT TOTAL KNEE BILATERAL    . ROTATOR CUFF REPAIR Bilateral    Social History   Socioeconomic History  . Marital status: Married    Spouse name: Not on file  . Number of children: Not on file  . Years of education: Not on file  . Highest education level: Not on file  Occupational History  . Not on file  Tobacco Use  . Smoking status: Never Smoker  . Smokeless tobacco: Never Used  Substance and Sexual Activity  . Alcohol use: Not on file  . Drug use: Not on file  . Sexual activity: Not on file  Other Topics Concern  . Not on file  Social History Narrative  . Not on file   Social Determinants of Health   Financial Resource Strain:   . Difficulty of Paying Living Expenses: Not on file  Food  Insecurity:   . Worried About Charity fundraiser in the Last Year: Not on file  . Ran Out of Food in the Last Year: Not on file  Transportation Needs:   . Lack of Transportation (Medical): Not on file  . Lack of Transportation (Non-Medical): Not on file  Physical Activity:   . Days of Exercise per Week: Not on file  . Minutes of Exercise per Session: Not on file  Stress:   . Feeling of Stress : Not on file  Social Connections:   . Frequency of Communication with Friends and Family: Not on file  . Frequency of Social Gatherings with Friends and Family: Not on file  . Attends Religious Services: Not on file  . Active Member of Clubs or Organizations: Not on file  . Attends Archivist Meetings: Not on file  . Marital Status: Not on file   Family History  Problem Relation Age of Onset  . Emphysema Father   . Aneurysm Father   . Allergic rhinitis Sister   . Cancer Sister   . Allergic rhinitis Sister    Allergies  Allergen Reactions  . Pregabalin Shortness Of Breath  . Ace Inhibitors Other (See Comments)  . Hydromorphone Itching  . Meperidine Itching   Prior to Admission medications   Medication Sig  Start Date End Date Taking? Authorizing Provider  albuterol (PROVENTIL HFA;VENTOLIN HFA) 108 (90 Base) MCG/ACT inhaler Inhale 2 puffs every 4 to 6 hours as needed for wheezing. 06/24/16  Yes [provider]  atorvastatin (LIPITOR) 10 MG tablet Take 10 mg by mouth daily.   Yes [provider]  B Complex Vitamins (VITAMIN B-COMPLEX) TABS Take by mouth.   Yes [provider]  budesonide-formoterol (SYMBICORT) 160-4.5 MCG/ACT inhaler Inhale 2 puffs into the lungs twice daily with spacer 03/02/19  Yes Kozlow, Donnamarie Poag, MD  Calcium Carb-Cholecalciferol (SM OYSTER SHELL CALCIUM/VIT D3 PO) Take by mouth.   Yes [provider]  diclofenac (VOLTAREN) 75 MG EC tablet Take 75 mg by mouth 2 (two) times daily.   Yes [provider]  diltiazem  (CARDIZEM CD) 120 MG 24 hr capsule Take 120 mg by mouth daily.   Yes [provider]  DULoxetine (CYMBALTA) 60 MG capsule Take 60 mg by mouth daily.   Yes [provider]  exemestane (AROMASIN) 25 MG tablet Take 25 mg by mouth daily. 06/19/20  Yes [provider]  gabapentin (NEURONTIN) 600 MG tablet Take 600 mg by mouth 3 (three) times daily.  11/06/18  Yes [provider]  LORazepam (ATIVAN) 1 MG tablet Take 1 mg by mouth at bedtime as needed for anxiety.  12/16/18  Yes [provider]  montelukast (SINGULAIR) 10 MG tablet  11/01/18  Yes [provider]  omeprazole (PRILOSEC) 20 MG capsule Take 20 mg by mouth daily.   Yes [provider]  potassium chloride (MICRO-K) 10 MEQ CR capsule Take 10 mEq by mouth daily.    Yes [provider]  SPIRIVA RESPIMAT 2.5 MCG/ACT AERS Inhale 2 puffs into the lungs daily. 07/08/20  Yes [provider]  triamcinolone cream (KENALOG) 0.1 % Apply 1 application topically 2 (two) times daily.  10/26/18  Yes [provider]  triamterene-hydrochlorothiazide (DYAZIDE) 37.5-25 MG capsule Take 1 capsule by mouth daily.   Yes [provider]  zolpidem (AMBIEN) 10 MG tablet Take 10 mg by mouth at bedtime as needed for sleep.  12/16/18  Yes [provider]  amLODipine (NORVASC) 5 MG tablet TAKE 1 TABLET EVERY DAY Patient not taking: Reported on 08/03/2020 12/19/18   [provider]  baclofen (LIORESAL) 10 MG tablet Take 10 mg by mouth at bedtime. Patient not taking: Reported on 08/03/2020 09/27/18   [provider]  buPROPion (WELLBUTRIN XL) 150 MG 24 hr tablet Take by mouth. Patient not taking: Reported on 08/03/2020 01/16/19   [provider]  fluticasone Asencion Islam) 50 MCG/ACT nasal spray Use one spray in each nostril twice daily Patient not taking: Reported on 08/03/2020 01/03/19   Jiles Prows, MD  furosemide (LASIX) 20 MG tablet Take 20 mg by mouth  daily as needed. Patient not taking: Reported on 08/03/2020 12/29/18   [provider]  letrozole [facility]) 2.5 MG tablet Take by mouth. Patient not taking: Reported on 08/03/2020 07/14/18   [provider]  QUEtiapine (SEROQUEL) 100 MG tablet TAKE 0.5 1 (ONE HALF TO ONE) TABLET BY MOUTH AT BEDTIME FOR MOOD STABILIZER INSOMNIA Patient not taking: Reported on 08/03/2020 12/12/18   [provider]   DG Ribs Unilateral W/Chest Left  Result Date: 08/02/2020 CLINICAL DATA:  Fall with shortness of breath EXAM: LEFT RIBS AND CHEST - 3+ VIEW COMPARISON:  01/04/2019 FINDINGS: Single-view chest demonstrates mild cardiomegaly. No consolidation, pleural effusion or pneumothorax. Left rib series demonstrates acute displaced  left third through seventh rib fractures. Minimal peripheral ground-glass opacity could reflect contusion. IMPRESSION: 1. Cardiomegaly without pleural effusion or pneumothorax 2. Acute displaced left third through seventh rib fractures. Minimal peripheral ground-glass opacity in the left thorax, possible contusion Electronically Signed   By: Donavan Foil M.D.   On: 08/02/2020 20:43   DG Wrist Complete Left  Result Date: 08/02/2020 CLINICAL DATA:  Fall with wrist pain EXAM: LEFT WRIST - COMPLETE 3+ VIEW COMPARISON:  None. FINDINGS: Acute nondisplaced mildly impacted intra-articular distal radius fracture. No subluxation. Positive for soft tissue swelling IMPRESSION: Acute nondisplaced intra-articular distal radius fracture. Electronically Signed   By: Donavan Foil M.D.   On: 08/02/2020 20:36   CT Head Wo Contrast  Result Date: 08/02/2020 CLINICAL DATA:  Status post trauma. EXAM: CT HEAD WITHOUT CONTRAST TECHNIQUE: Contiguous axial images were obtained from the base of the skull through the vertex without intravenous contrast. COMPARISON:  None. FINDINGS: Brain: No evidence of acute infarction, hemorrhage, hydrocephalus, extra-axial collection or mass lesion/mass effect. And  should be noted that the cerebellum is markedly limited in evaluation secondary to extensive amount of overlying artifact. Vascular: No hyperdense vessel or unexpected calcification. Skull: Normal. Negative for fracture or focal lesion. Sinuses/Orbits: No acute finding. Other: There is moderate severity posterior parietal scalp soft tissue swelling on the left. This extends toward the vertex. An associated 2.9 cm x 1.2 cm scalp hematoma is noted. IMPRESSION: 1. Limited evaluation of the cerebellum secondary to overlying artifact. 2. Moderate severity posterior parietal scalp soft tissue swelling on the left with an associated 2.9 cm x 1.2 cm scalp hematoma. 3. No acute intracranial abnormality. Electronically Signed   By: Virgina Norfolk M.D.   On: 08/02/2020 20:30   CT Chest W Contrast  Result Date: 08/02/2020 CLINICAL DATA:  Status post trauma. EXAM: CT CHEST, ABDOMEN, AND PELVIS WITH CONTRAST TECHNIQUE: Multidetector CT imaging of the chest, abdomen and pelvis was performed following the standard protocol during bolus administration of intravenous contrast. CONTRAST:  187m OMNIPAQUE IOHEXOL 300 MG/ML  SOLN COMPARISON:  None. FINDINGS: CT CHEST FINDINGS Cardiovascular: There is mild calcification of the aortic arch. Normal heart size. No pericardial effusion. Mediastinum/Nodes: No enlarged mediastinal, hilar, or axillary lymph nodes. Thyroid gland, trachea, and esophagus demonstrate no significant findings. Lungs/Pleura: Moderate severity atelectasis is seen along the posterior aspect of the bilateral lower lobes. A very small left pleural effusion is seen. A very small (approximately 3 mm) pneumothorax is seen along the anteromedial aspect of the left apex. An additional 5 mm pneumothorax is seen along the anterolateral aspect of the mid left lung. Musculoskeletal: Acute third, fourth, fifth, sixth and seventh left rib fractures are seen. CT ABDOMEN PELVIS FINDINGS Hepatobiliary: No focal liver abnormality  is seen. No gallstones, gallbladder wall thickening, or biliary dilatation. Pancreas: Unremarkable. No pancreatic ductal dilatation or surrounding inflammatory changes. Spleen: Normal in size without focal abnormality. Adrenals/Urinary Tract: Adrenal glands are unremarkable. The right kidney is normal in size and position. A pelvic left kidney is seen. There is no evidence of renal calculi, focal lesion, or hydronephrosis. Bladder is unremarkable. Stomach/Bowel: There is a small hiatal hernia. The appendix is not identified. No evidence of bowel wall thickening, distention, or inflammatory changes. Vascular/Lymphatic: There is moderate severity calcification of the abdominal aorta and bilateral common iliac arteries. No enlarged abdominal or pelvic lymph nodes. Reproductive: Status post hysterectomy. No adnexal masses. Other: No abdominal wall hernia or abnormality. No abdominopelvic ascites. Musculoskeletal: Multilevel degenerative changes seen throughout the  lumbar spine. IMPRESSION: 1. Very small left apical and anterolateral mid left lung pneumothoraces. 2. Multiple acute left rib fractures. 3. Moderate severity posterior bilateral lower lobe atelectasis with a very small left pleural effusion. 4. Pelvic left kidney. 5. Small hiatal hernia. 6. Aortic atherosclerosis. Aortic Atherosclerosis (ICD10-I70.0). Electronically Signed   By: Virgina Norfolk M.D.   On: 08/02/2020 21:51   CT Cervical Spine Wo Contrast  Result Date: 08/02/2020 CLINICAL DATA:  Status post trauma. EXAM: CT CERVICAL SPINE WITHOUT CONTRAST TECHNIQUE: Multidetector CT imaging of the cervical spine was performed without intravenous contrast. Multiplanar CT image reconstructions were also generated. COMPARISON:  None. FINDINGS: Alignment: Normal. Skull base and vertebrae: No acute fracture. No primary bone lesion or focal pathologic process. Soft tissues and spinal canal: No prevertebral fluid or swelling. No visible canal hematoma. Disc  levels: Moderate severity endplate sclerosis is seen at the levels of C5-C6 and C6-C7. Mild anterior osteophyte formation is seen at the level of C4-C5. Marked severity intervertebral disc space narrowing is seen at the levels of C5-C6 and C6-C7. Moderate severity bilateral multilevel facet joint hypertrophy is noted. Upper chest: A very small anteromedial left apical pneumothorax is seen (approximately 4 mm). Other: A nondisplaced fourth left rib fracture is seen on the coronal views (coronal CT images 97 through 100, CT series number 9). IMPRESSION: 1. Very small anteromedial left apical pneumothorax. 2. Marked severity degenerative changes at the levels of C5-C6 and C6-C7. 3. Nondisplaced fourth left rib fracture. 4. No acute fracture within the cervical spine. Electronically Signed   By: Virgina Norfolk M.D.   On: 08/02/2020 20:39   CT Abdomen Pelvis W Contrast  Result Date: 08/02/2020 CLINICAL DATA:  Status post trauma. EXAM: CT CHEST, ABDOMEN, AND PELVIS WITH CONTRAST TECHNIQUE: Multidetector CT imaging of the chest, abdomen and pelvis was performed following the standard protocol during bolus administration of intravenous contrast. CONTRAST:  145m OMNIPAQUE IOHEXOL 300 MG/ML  SOLN COMPARISON:  None. FINDINGS: CT CHEST FINDINGS Cardiovascular: There is mild calcification of the aortic arch. Normal heart size. No pericardial effusion. Mediastinum/Nodes: No enlarged mediastinal, hilar, or axillary lymph nodes. Thyroid gland, trachea, and esophagus demonstrate no significant findings. Lungs/Pleura: Moderate severity atelectasis is seen along the posterior aspect of the bilateral lower lobes. A very small left pleural effusion is seen. A very small (approximately 3 mm) pneumothorax is seen along the anteromedial aspect of the left apex. An additional 5 mm pneumothorax is seen along the anterolateral aspect of the mid left lung. Musculoskeletal: Acute third, fourth, fifth, sixth and seventh left rib  fractures are seen. CT ABDOMEN PELVIS FINDINGS Hepatobiliary: No focal liver abnormality is seen. No gallstones, gallbladder wall thickening, or biliary dilatation. Pancreas: Unremarkable. No pancreatic ductal dilatation or surrounding inflammatory changes. Spleen: Normal in size without focal abnormality. Adrenals/Urinary Tract: Adrenal glands are unremarkable. The right kidney is normal in size and position. A pelvic left kidney is seen. There is no evidence of renal calculi, focal lesion, or hydronephrosis. Bladder is unremarkable. Stomach/Bowel: There is a small hiatal hernia. The appendix is not identified. No evidence of bowel wall thickening, distention, or inflammatory changes. Vascular/Lymphatic: There is moderate severity calcification of the abdominal aorta and bilateral common iliac arteries. No enlarged abdominal or pelvic lymph nodes. Reproductive: Status post hysterectomy. No adnexal masses. Other: No abdominal wall hernia or abnormality. No abdominopelvic ascites. Musculoskeletal: Multilevel degenerative changes seen throughout the lumbar spine. IMPRESSION: 1. Very small left apical and anterolateral mid left lung pneumothoraces. 2. Multiple acute left  rib fractures. 3. Moderate severity posterior bilateral lower lobe atelectasis with a very small left pleural effusion. 4. Pelvic left kidney. 5. Small hiatal hernia. 6. Aortic atherosclerosis. Aortic Atherosclerosis (ICD10-I70.0). Electronically Signed   By: Virgina Norfolk M.D.   On: 08/02/2020 21:50   DG CHEST PORT 1 VIEW  Result Date: 08/03/2020 CLINICAL DATA:  Pneumothorax EXAM: PORTABLE CHEST 1 VIEW COMPARISON:  08/02/2020, CT chest 08/02/2020 FINDINGS: Tiny pneumothorax seen by CT is not well seen radiographically. There is cardiomegaly with prominent central pulmonary vessels. No pleural effusion. Multiple acute left-sided rib fractures. Patchy airspace disease at the left base. IMPRESSION: 1. Tiny left apical and anterolateral  pneumothorax seen by CT is not well seen radiographically. 2. Cardiomegaly with prominent central pulmonary vessels. Minimal airspace disease at the left base Electronically Signed   By: Donavan Foil M.D.   On: 08/03/2020 02:03   DG Shoulder Left  Result Date: 08/02/2020 CLINICAL DATA:  Fall with shoulder pain EXAM: LEFT SHOULDER - 2+ VIEW COMPARISON:  None. FINDINGS: mild AC joint degenerative change. Slight anterior positioning of the humeral head with respect to the glenoid fossa on Y-views suspect that this is largely related to positioning. Alignment appears within normal limits on other views. There are degenerative changes of the glenohumeral joint. IMPRESSION: Slight anterior positioning of the humeral head with respect to the glenoid fossa on the attempted Y-views, suspect that this is largely related to positioning. No definite acute osseous abnormality is seen Electronically Signed   By: Donavan Foil M.D.   On: 08/02/2020 20:39   DG Knee Complete 4 Views Left  Result Date: 08/02/2020 CLINICAL DATA:  Fall with knee pain EXAM: LEFT KNEE - COMPLETE 4+ VIEW COMPARISON:  None. FINDINGS: No fracture or malalignment. Status post left knee replacement with intact hardware. Small knee effusion. IMPRESSION: Status post left knee replacement. Small knee effusion. Electronically Signed   By: Donavan Foil M.D.   On: 08/02/2020 20:35   DG Hip Unilat With Pelvis 2-3 Views Left  Result Date: 08/02/2020 CLINICAL DATA:  Fall EXAM: DG HIP (WITH OR WITHOUT PELVIS) 2-3V LEFT COMPARISON:  None. FINDINGS: SI joints are non widened. No acute displaced fracture or malalignment. Mild joint space narrowing. IMPRESSION: No acute osseous abnormality. Electronically Signed   By: Donavan Foil M.D.   On: 08/02/2020 20:34    Positive ROS: All other systems have been reviewed and were otherwise negative with the exception of those mentioned in the HPI and as above.  Labs cbc Recent Labs    08/02/20 1922  WBC 14.4*   HGB 11.7*  HCT 37.6  PLT 217    Labs inflam No results for input(s): CRP in the last 72 hours.  Invalid input(s): ESR  Labs coag No results for input(s): INR, PTT in the last 72 hours.  Invalid input(s): PT  Recent Labs    08/02/20 1922  NA 139  K 4.0  CL 104  CO2 25  GLUCOSE 123*  BUN 24*  CREATININE 1.04*  CALCIUM 8.8*    Physical Exam: Vitals:   08/03/20 2005 08/04/20 0426  BP: (!) 96/42 (!) 100/49  Pulse: 88 89  Resp: 18 18  Temp: 98.4 F (36.9 C) 98.1 F (36.7 C)  SpO2: 90% 91%   General: Alert, no acute distress, resting in bed  Cardiovascular: No pedal edema Respiratory: No cyanosis, no use of accessory musculature GI: No organomegaly, abdomen is soft and non-tender Skin: No lesions in the area of chief complaint other  than those listed below in MSK exam.  Neurologic: Sensation intact distally. Psychiatric: Patient is competent for consent with normal mood and affect Lymphatic: No axillary or cervical lymphadenopathy  MUSCULOSKELETAL:  LUE with splint present. NVI, good cap refill, sensation intact Other extremities are atraumatic with painless ROM and NVI.  Assessment: Acute nondisplaced intra-articular distal left radius fracture   Plan: Will plan for ORIF of the left radial fx tomorrow morning for better allignment and faster mobility. Weight Bearing Status: NWB to LUE PT VTE px: per trauma team. Please hold Lovenox tomorrow morning.  NPO after MN tonight.  Plan was discussed with pt. Who agrees.    Rachael Fee, PA-C    08/04/2020 7:58 AM

## 2020-08-05 ENCOUNTER — Encounter (HOSPITAL_COMMUNITY): Payer: Self-pay

## 2020-08-05 ENCOUNTER — Inpatient Hospital Stay (HOSPITAL_COMMUNITY): Payer: Medicare Other | Admitting: Anesthesiology

## 2020-08-05 ENCOUNTER — Inpatient Hospital Stay (HOSPITAL_COMMUNITY): Payer: Medicare Other

## 2020-08-05 ENCOUNTER — Encounter (HOSPITAL_COMMUNITY): Admission: EM | Disposition: A | Discharge status: Home Health | Payer: Self-pay | Source: Home / Self Care

## 2020-08-05 HISTORY — PX: ORIF WRIST FRACTURE: SHX2133

## 2020-08-05 LAB — CBC
HCT: 31.2 % — ABNORMAL LOW (ref 36.0–46.0)
Hemoglobin: 9.6 g/dL — ABNORMAL LOW (ref 12.0–15.0)
MCH: 27.9 pg (ref 26.0–34.0)
MCHC: 30.8 g/dL (ref 30.0–36.0)
MCV: 90.7 fL (ref 80.0–100.0)
Platelets: 167 10*3/uL (ref 150–400)
RBC: 3.44 MIL/uL — ABNORMAL LOW (ref 3.87–5.11)
RDW: 13.3 % (ref 11.5–15.5)
WBC: 7.8 10*3/uL (ref 4.0–10.5)
nRBC: 0 % (ref 0.0–0.2)

## 2020-08-05 LAB — BASIC METABOLIC PANEL
Anion gap: 8 (ref 5–15)
BUN: 18 mg/dL (ref 8–23)
CO2: 29 mmol/L (ref 22–32)
Calcium: 8.8 mg/dL — ABNORMAL LOW (ref 8.9–10.3)
Chloride: 104 mmol/L (ref 98–111)
Creatinine, Ser: 0.85 mg/dL (ref 0.44–1.00)
GFR calc Af Amer: >60 mL/min (ref >60)
GFR calc non Af Amer: >60 mL/min (ref >60)
Glucose, Bld: 125 mg/dL — ABNORMAL HIGH (ref 70–99)
Potassium: 3.7 mmol/L (ref 3.5–5.1)
Sodium: 141 mmol/L (ref 135–145)

## 2020-08-05 LAB — SURGICAL PCR SCREEN
MRSA, PCR: NEGATIVE
Staphylococcus aureus: POSITIVE — AB

## 2020-08-05 SURGERY — OPEN REDUCTION INTERNAL FIXATION (ORIF) WRIST FRACTURE
Anesthesia: Monitor Anesthesia Care | Site: Wrist | Laterality: Left

## 2020-08-05 MED ORDER — LIDOCAINE 2% (20 MG/ML) 5 ML SYRINGE
INTRAMUSCULAR | Status: AC
  Filled 2020-08-05: qty 5

## 2020-08-05 MED ORDER — GABAPENTIN 300 MG PO CAPS
600.0000 mg | ORAL_CAPSULE | Freq: Three times a day (TID) | ORAL | Status: DC
  Administered 2020-08-05 – 2020-08-06 (×3): 600 mg via ORAL
  Filled 2020-08-05 (×3): qty 2

## 2020-08-05 MED ORDER — CHLORHEXIDINE GLUCONATE CLOTH 2 % EX PADS: 6.0000 | MEDICATED_PAD | Freq: Every day | CUTANEOUS | Status: DC

## 2020-08-05 MED ORDER — FENTANYL CITRATE (PF) 100 MCG/2ML IJ SOLN
INTRAMUSCULAR | Status: AC
  Administered 2020-08-05: 50 ug via INTRAVENOUS
  Filled 2020-08-05: qty 2

## 2020-08-05 MED ORDER — DEXAMETHASONE SODIUM PHOSPHATE 10 MG/ML IJ SOLN
INTRAMUSCULAR | Status: AC
  Filled 2020-08-05: qty 1

## 2020-08-05 MED ORDER — CLONIDINE HCL (ANALGESIA) 100 MCG/ML EP SOLN
EPIDURAL | Status: DC | PRN
  Administered 2020-08-05: 100 ug

## 2020-08-05 MED ORDER — FENTANYL CITRATE (PF) 250 MCG/5ML IJ SOLN
INTRAMUSCULAR | Status: AC
  Filled 2020-08-05: qty 5

## 2020-08-05 MED ORDER — MUPIROCIN 2 % EX OINT
1.0000 "application " | TOPICAL_OINTMENT | Freq: Two times a day (BID) | CUTANEOUS | Status: DC
  Administered 2020-08-05 – 2020-08-06 (×2): 1 via NASAL
  Filled 2020-08-05: qty 22

## 2020-08-05 MED ORDER — LIDOCAINE 2% (20 MG/ML) 5 ML SYRINGE
INTRAMUSCULAR | Status: DC | PRN
  Administered 2020-08-05: 60 mg via INTRAVENOUS

## 2020-08-05 MED ORDER — FENTANYL CITRATE (PF) 100 MCG/2ML IJ SOLN
INTRAMUSCULAR | Status: DC | PRN
Start: 2020-08-05 — End: 2020-08-05
  Administered 2020-08-05: 50 ug via INTRAVENOUS

## 2020-08-05 MED ORDER — MIDAZOLAM HCL 2 MG/2ML IJ SOLN
INTRAMUSCULAR | Status: AC
  Filled 2020-08-05: qty 2

## 2020-08-05 MED ORDER — DEXAMETHASONE SODIUM PHOSPHATE 4 MG/ML IJ SOLN
INTRAMUSCULAR | Status: DC | PRN
  Administered 2020-08-05: 4 mg via INTRAVENOUS

## 2020-08-05 MED ORDER — ONDANSETRON HCL 4 MG/2ML IJ SOLN
INTRAMUSCULAR | Status: DC | PRN
  Administered 2020-08-05: 4 mg via INTRAVENOUS

## 2020-08-05 MED ORDER — EPHEDRINE 5 MG/ML INJ
INTRAVENOUS | Status: AC
  Filled 2020-08-05: qty 10

## 2020-08-05 MED ORDER — PROPOFOL 500 MG/50ML IV EMUL
INTRAVENOUS | Status: DC | PRN
  Administered 2020-08-05: 75 ug/kg/min via INTRAVENOUS

## 2020-08-05 MED ORDER — PROPOFOL 10 MG/ML IV BOLUS
INTRAVENOUS | Status: AC
  Filled 2020-08-05: qty 20

## 2020-08-05 MED ORDER — 0.9 % SODIUM CHLORIDE (POUR BTL) OPTIME
TOPICAL | Status: DC | PRN
  Administered 2020-08-05: 1000 mL

## 2020-08-05 MED ORDER — CHLORHEXIDINE GLUCONATE 0.12 % MT SOLN
OROMUCOSAL | Status: AC
  Administered 2020-08-05: 15 mL via OROMUCOSAL
  Filled 2020-08-05: qty 15

## 2020-08-05 MED ORDER — FENTANYL CITRATE (PF) 100 MCG/2ML IJ SOLN: 50.0000 ug | Freq: Once | INTRAMUSCULAR | Status: AC

## 2020-08-05 MED ORDER — LACTATED RINGERS IV SOLN: INTRAVENOUS | Status: DC

## 2020-08-05 MED ORDER — ROPIVACAINE HCL 7.5 MG/ML IJ SOLN
INTRAMUSCULAR | Status: DC | PRN
  Administered 2020-08-05: 20 mL via PERINEURAL

## 2020-08-05 MED ORDER — LACTATED RINGERS IV SOLN: INTRAVENOUS | Status: DC | PRN

## 2020-08-05 MED ORDER — DEXMEDETOMIDINE (PRECEDEX) IN NS 20 MCG/5ML (4 MCG/ML) IV SYRINGE
PREFILLED_SYRINGE | INTRAVENOUS | Status: DC | PRN
  Administered 2020-08-05 (×2): 20 ug via INTRAVENOUS

## 2020-08-05 MED ORDER — CEFAZOLIN SODIUM-DEXTROSE 2-4 GM/100ML-% IV SOLN
INTRAVENOUS | Status: AC
  Filled 2020-08-05: qty 100

## 2020-08-05 MED ORDER — CEFAZOLIN SODIUM-DEXTROSE 2-4 GM/100ML-% IV SOLN
2.0000 g | INTRAVENOUS | Status: AC
  Administered 2020-08-05: 2 g via INTRAVENOUS

## 2020-08-05 MED ORDER — ONDANSETRON HCL 4 MG/2ML IJ SOLN
INTRAMUSCULAR | Status: AC
  Filled 2020-08-05: qty 2

## 2020-08-05 MED ORDER — EPHEDRINE SULFATE-NACL 50-0.9 MG/10ML-% IV SOSY
PREFILLED_SYRINGE | INTRAVENOUS | Status: DC | PRN
  Administered 2020-08-05: 5 mg via INTRAVENOUS

## 2020-08-05 MED ORDER — MIDAZOLAM HCL 5 MG/5ML IJ SOLN
INTRAMUSCULAR | Status: DC | PRN
  Administered 2020-08-05 (×2): 1 mg via INTRAVENOUS

## 2020-08-05 MED ORDER — PHENYLEPHRINE 40 MCG/ML (10ML) SYRINGE FOR IV PUSH (FOR BLOOD PRESSURE SUPPORT)
PREFILLED_SYRINGE | INTRAVENOUS | Status: AC
  Filled 2020-08-05: qty 10

## 2020-08-05 MED ORDER — PHENYLEPHRINE 40 MCG/ML (10ML) SYRINGE FOR IV PUSH (FOR BLOOD PRESSURE SUPPORT)
PREFILLED_SYRINGE | INTRAVENOUS | Status: DC | PRN
  Administered 2020-08-05 (×2): 80 ug via INTRAVENOUS

## 2020-08-05 MED ORDER — PROPOFOL 10 MG/ML IV BOLUS
INTRAVENOUS | Status: DC | PRN
  Administered 2020-08-05: 30 mg via INTRAVENOUS

## 2020-08-05 MED ORDER — CHLORHEXIDINE GLUCONATE 0.12 % MT SOLN: 15.0000 mL | Freq: Once | OROMUCOSAL | Status: AC

## 2020-08-05 SURGICAL SUPPLY — 64 items
BENZOIN TINCTURE PRP APPL 2/3 (GAUZE/BANDAGES/DRESSINGS) ×1 IMPLANT
BIT DRILL 2.2 SS TIBIAL (BIT) ×2 IMPLANT
BLADE CLIPPER SURG (BLADE) IMPLANT
BNDG COHESIVE 4X5 TAN STRL (GAUZE/BANDAGES/DRESSINGS) ×3 IMPLANT
BNDG ELASTIC 3X5.8 VLCR STR LF (GAUZE/BANDAGES/DRESSINGS) ×1 IMPLANT
BNDG ELASTIC 4X5.8 VLCR STR LF (GAUZE/BANDAGES/DRESSINGS) ×3 IMPLANT
BNDG ESMARK 4X9 LF (GAUZE/BANDAGES/DRESSINGS) ×3 IMPLANT
CLOSURE WOUND 1/2 X4 (GAUZE/BANDAGES/DRESSINGS)
CORD BIPOLAR FORCEPS 12FT (ELECTRODE) ×3 IMPLANT
COVER SURGICAL LIGHT HANDLE (MISCELLANEOUS) ×6 IMPLANT
COVER WAND RF STERILE (DRAPES) ×3 IMPLANT
CUFF TOURN SGL QUICK 18X4 (TOURNIQUET CUFF) ×3 IMPLANT
CUFF TOURN SGL QUICK 24 (TOURNIQUET CUFF)
CUFF TRNQT CYL 24X4X16.5-23 (TOURNIQUET CUFF) IMPLANT
DECANTER SPIKE VIAL GLASS SM (MISCELLANEOUS) IMPLANT
DRAPE OEC MINIVIEW 54X84 (DRAPES) ×2 IMPLANT
DRAPE U-SHAPE 47X51 STRL (DRAPES) ×3 IMPLANT
DRSG EMULSION OIL 3X3 NADH (GAUZE/BANDAGES/DRESSINGS) ×2 IMPLANT
ELECT REM PT RETURN 9FT ADLT (ELECTROSURGICAL) ×3
ELECTRODE REM PT RTRN 9FT ADLT (ELECTROSURGICAL) IMPLANT
GAUZE SPONGE 4X4 12PLY STRL (GAUZE/BANDAGES/DRESSINGS) ×3 IMPLANT
GAUZE XEROFORM 1X8 LF (GAUZE/BANDAGES/DRESSINGS) ×1 IMPLANT
GLOVE BIO SURGEON STRL SZ7.5 (GLOVE) ×6 IMPLANT
GLOVE BIOGEL PI IND STRL 8 (GLOVE) ×2 IMPLANT
GLOVE BIOGEL PI INDICATOR 8 (GLOVE) ×4
GOWN STRL REUS W/ TWL LRG LVL3 (GOWN DISPOSABLE) ×2 IMPLANT
GOWN STRL REUS W/ TWL XL LVL3 (GOWN DISPOSABLE) ×1 IMPLANT
GOWN STRL REUS W/TWL LRG LVL3 (GOWN DISPOSABLE) ×4
GOWN STRL REUS W/TWL XL LVL3 (GOWN DISPOSABLE) ×2
K-WIRE 1.6 (WIRE) ×4
K-WIRE FX5X1.6XNS BN SS (WIRE) ×2
KIT BASIN OR (CUSTOM PROCEDURE TRAY) ×3 IMPLANT
KIT TURNOVER KIT B (KITS) ×3 IMPLANT
KWIRE FX5X1.6XNS BN SS (WIRE) IMPLANT
MANIFOLD NEPTUNE II (INSTRUMENTS) ×1 IMPLANT
NDL HYPO 25GX1X1/2 BEV (NEEDLE) IMPLANT
NEEDLE HYPO 25GX1X1/2 BEV (NEEDLE) IMPLANT
NS IRRIG 1000ML POUR BTL (IV SOLUTION) ×6 IMPLANT
PACK ORTHO EXTREMITY (CUSTOM PROCEDURE TRAY) ×3 IMPLANT
PAD ARMBOARD 7.5X6 YLW CONV (MISCELLANEOUS) ×6 IMPLANT
PAD CAST 4YDX4 CTTN HI CHSV (CAST SUPPLIES) ×1 IMPLANT
PADDING CAST COTTON 4X4 STRL (CAST SUPPLIES)
PEG LOCKING SMOOTH 2.2X16 (Screw) ×2 IMPLANT
PEG LOCKING SMOOTH 2.2X18 (Peg) ×6 IMPLANT
PEG LOCKING SMOOTH 2.2X20 (Screw) ×6 IMPLANT
PLATE STANDARD DVR LEFT (Plate) ×3 IMPLANT
PLATE STD DVR LT 24X51 (Plate) IMPLANT
SCREW  LP NL 2.7X15MM (Screw) ×2 IMPLANT
SCREW 2.7X14MM (Screw) ×4 IMPLANT
SCREW BN 14X2.7XNONLOCK 3 LD (Screw) IMPLANT
SCREW LP NL 2.7X15MM (Screw) IMPLANT
SLING ARM IMMOBILIZER LRG (SOFTGOODS) ×2 IMPLANT
SPONGE LAP 4X18 RFD (DISPOSABLE) ×3 IMPLANT
STRIP CLOSURE SKIN 1/2X4 (GAUZE/BANDAGES/DRESSINGS) ×1 IMPLANT
SUT ETHILON 3 0 PS 1 (SUTURE) ×2 IMPLANT
SUT MNCRL AB 4-0 PS2 18 (SUTURE) ×3 IMPLANT
SYR CONTROL 10ML LL (SYRINGE) IMPLANT
TOWEL GREEN STERILE (TOWEL DISPOSABLE) ×3 IMPLANT
TOWEL GREEN STERILE FF (TOWEL DISPOSABLE) ×3 IMPLANT
TOWEL OR NON WOVEN STRL DISP B (DISPOSABLE) ×3 IMPLANT
TUBE CONNECTING 12'X1/4 (SUCTIONS) ×1
TUBE CONNECTING 12X1/4 (SUCTIONS) ×2 IMPLANT
WATER STERILE IRR 1000ML POUR (IV SOLUTION) ×2 IMPLANT
YANKAUER SUCT BULB TIP NO VENT (SUCTIONS) IMPLANT

## 2020-08-05 NOTE — Anesthesia Procedure Notes (Signed)
Procedure Name: MAC Date/Time: 08/05/2020 9:49 AM Performed by: Orlie Dakin, CRNA Pre-anesthesia Checklist: Patient identified, Emergency Drugs available, Suction available and Patient being monitored Patient Re-evaluated:Patient Re-evaluated prior to induction Oxygen Delivery Method: Simple face mask Preoxygenation: Pre-oxygenation with 100% oxygen Induction Type: IV induction Placement Confirmation: positive ETCO2

## 2020-08-05 NOTE — Progress Notes (Addendum)
Day of Surgery  Subjective: In short stay for wrist surgery.  Still with some chest pain from rib fractures.  Gets light headed when sitting up at times.  C/o left hip pain.  ROS: See above, otherwise other systems negative  Objective: Vital signs in last 24 hours: Temp:  [98.3 F (36.8 C)-98.9 F (37.2 C)] 98.3 F (36.8 C) (08/31 0424) Pulse Rate:  [87-101] 91 (08/31 0905) Resp:  [17-20] 19 (08/31 0905) BP: (114-175)/(59-91) 155/72 (08/31 0905) SpO2:  [92 %-99 %] 97 % (08/31 0905) Last BM Date: 08/02/20  Intake/Output from previous day: 08/30 0701 - 08/31 0700 In: 2541.7 [P.O.:150; I.V.:2391.7] Out: -  Intake/Output this shift: No intake/output data recorded.  PE: Gen: Alert, NAD, pleasant HEENT: EOM's intact, pupils equal and round Card: RRR Pulm: CTAB,chest tenderness as expected. Abd: Soft, NT/ND, +BS. Ext:L wrist in splint. Moves all digits.  Left hip with significant ecchymosis present Psych: A&Ox3  Skin: no rashes noted, warm and dry  Lab Results:  Recent Labs    08/02/20 1922 08/05/20 0225  WBC 14.4* 7.8  HGB 11.7* 9.6*  HCT 37.6 31.2*  PLT 217 167   BMET Recent Labs    08/02/20 1922 08/05/20 0225  NA 139 141  K 4.0 3.7  CL 104 104  CO2 25 29  GLUCOSE 123* 125*  BUN 24* 18  CREATININE 1.04* 0.85  CALCIUM 8.8* 8.8*   PT/INR No results for input(s): LABPROT, INR in the last 72 hours. CMP     Component Value Date/Time   NA 141 08/05/2020 0225   K 3.7 08/05/2020 0225   CL 104 08/05/2020 0225   CO2 29 08/05/2020 0225   GLUCOSE 125 (H) 08/05/2020 0225   BUN 18 08/05/2020 0225   CREATININE 0.85 08/05/2020 0225   CALCIUM 8.8 (L) 08/05/2020 0225   PROT 6.2 (L) 08/02/2020 1922   ALBUMIN 3.7 08/02/2020 1922   AST 28 08/02/2020 1922   ALT 26 08/02/2020 1922   ALKPHOS 124 08/02/2020 1922   BILITOT 0.3 08/02/2020 1922   GFRNONAA >60 08/05/2020 0225   GFRAA >60 08/05/2020 0225   Lipase  No results found for:  LIPASE     Studies/Results: DG MINI C-ARM IMAGE ONLY  Result Date: 08/05/2020 There is no interpretation for this exam.  This order is for images obtained during a surgical procedure.  Please See "Surgeries" Tab for more information regarding the procedure.    Anti-infectives: Anti-infectives (From admission, onward)   Start     Dose/Rate Route Frequency Ordered Stop   08/05/20 0830  ceFAZolin (ANCEF) IVPB 2g/100 mL premix        2 g 200 mL/hr over 30 Minutes Intravenous On call to O.R. 08/05/20 0820 08/06/20 0559   08/05/20 0823  ceFAZolin (ANCEF) 2-4 GM/100ML-% IVPB       Note to Pharmacy: Henrine Screws   : cabinet override      08/05/20 0823 08/05/20 2029       Assessment/Plan Fall down stairs Left radius fx-OR today by Dr. Percell Miller Left 3-7 rib fx/ ptx on ct scan-multimodalpain control. Pulm toilet.  Left hip pain - negative plain films, pain control Left knee pain - Negative plain films  HTN HLD GERD ABL anemia - hgb down to 9 from 11, recheck in am FEN -NPO for surgery, may eat when out of surgery VTE -SCDs, lovenox ID -None Dispo - HHPT/OT   LOS: 2 days    Henreitta Cea , [facility]  Surgery 08/05/2020, 9:12 AM Please see Amion for pager number during day hours 7:00am-4:30pm or 7:00am -11:30am on weekends

## 2020-08-05 NOTE — Progress Notes (Signed)
Orthopedic Tech Progress Note Patient Details:  Brittany Berger May 31, 1951 [id number] [place] Dropped wrist splint off at OR desk   Post Interventions Patient Tolerated: Well Instructions Provided: Care of device, Adjustment of device   [name] A [name] 08/05/2020, 10:14 AM

## 2020-08-05 NOTE — Transfer of Care (Signed)
Immediate Anesthesia Transfer of Care Note  Patient: Brittany Berger  Procedure(s) Performed: OPEN REDUCTION INTERNAL FIXATION (ORIF)  LEFT WRIST FRACTURE (Left Wrist)  Patient Location: PACU  Anesthesia Type:MAC and Regional  Level of Consciousness: awake and patient cooperative  Airway & Oxygen Therapy: Patient Spontanous Breathing and Patient connected to face mask oxygen  Post-op Assessment: Report given to RN and Post -op Vital signs reviewed and stable  Post vital signs: Reviewed and stable  Last Vitals:  Vitals Value Taken Time  BP    Temp    Pulse    Resp    SpO2      Last Pain:  Vitals:   08/05/20 0905  TempSrc:   PainSc: 0-No pain      Patients Stated Pain Goal: 2 (29/57/47 [id number])  Complications: No complications documented.

## 2020-08-05 NOTE — Progress Notes (Signed)
PT Cancellation Note  Patient Details Name: Brittany Berger MRN: [id number] DOB: 08-Sep-1951   Cancelled Treatment:    Reason Eval/Treat Not Completed: Patient at procedure or test/unavailable. Patient at surgery for ORIF wrist. Will follow up when appropriate.    [name] 08/05/2020, 9:08 AM

## 2020-08-05 NOTE — Op Note (Signed)
08/05/2020  10:46 AM  PATIENT:  Brittany Berger    PRE-OPERATIVE DIAGNOSIS:  fractured left wrist  POST-OPERATIVE DIAGNOSIS:  Same  PROCEDURE:  OPEN REDUCTION INTERNAL FIXATION (ORIF)  LEFT WRIST FRACTURE  SURGEON:  Renette Butters, MD  ASSISTANT: Margy Clarks, PA-C, he was present and scrubbed throughout the case, critical for completion in a timely fashion, and for retraction, instrumentation, and closure.   ANESTHESIA:   gen  PREOPERATIVE INDICATIONS:  [name] is a  69 y.o. female with a diagnosis of fractured left wrist who failed conservative measures and elected for surgical management.    The risks benefits and alternatives were discussed with the patient preoperatively including but not limited to the risks of infection, bleeding, nerve injury, cardiopulmonary complications, the need for revision surgery, among others, and the patient was willing to proceed.  OPERATIVE IMPLANTS: DVR plate  OPERATIVE FINDINGS: unstable fx  BLOOD LOSS: min  COMPLICATIONS: none  TOURNIQUET TIME: 16min  OPERATIVE PROCEDURE:  Patient was identified in the preoperative holding area and site was marked by me She was transported to the operating theater and placed on the table in supine position taking care to pad all bony prominences. After a preincinduction time out anesthesia was induced. The left upper extremity was prepped and draped in normal sterile fashion and a pre-incision timeout was performed. She received ancef for preoperative antibiotics.   I made a 5 cm incision centered over her FCR tendon and dissected down carefully to the level of the flexor tendon sheath and incise this longitudinally and retracted the FCR radially and incised the dorsal aspect of the sheath.   I bluntly dissected the FPL muscle belly away from the brachioradialis and then sharply incised the pronator tendon from the distal radius and from the wrist capsule. I Elevated this off the bone the fractures  visible.   I released the brachioradialis from its insertion. I then debrided the fracture and performed a manual reduction. There were at least 3 articular pieces of the fracture.  I selected a plate and I placed it on the bone. I pinned it into place and was happy on multiple radiographic views with it's placement. I then fixed the plate distally with the locking pegs. I confirmed no articular penetration with the pegs and that none were prominent dorsally.   I then reduced the plate to the shaft improving the volar and radial tilt of her distal radius.  I was happy with the final fluoro xrays. I reviewed more than 3 views of the wrist including obliques and ap/lat   I thoroughly irrigated the wound and closed the pronator over top of the plate and then closed the skin in layers with absorbable stitch. Sterile dressing was applied using the PACU in stable condition.  POST OPERATIVE PLAN: NWB, Splint full time. Ambulate for DVT px.

## 2020-08-05 NOTE — Interval H&P Note (Signed)
History and Physical Interval Note:  08/05/2020 6:32 AM  Brittany Berger  has presented today for surgery, with the diagnosis of fractured left wrist.  The various methods of treatment have been discussed with the patient and family. After consideration of risks, benefits and other options for treatment, the patient has consented to  Procedure(s): OPEN REDUCTION INTERNAL FIXATION (ORIF)  LEFT WRIST FRACTURE (Left) as a surgical intervention.  The patient's history has been reviewed, patient examined, no change in status, stable for surgery.  I have reviewed the patient's chart and labs.  Questions were answered to the patient's satisfaction.     Renette Butters

## 2020-08-05 NOTE — Anesthesia Procedure Notes (Signed)
Anesthesia Regional Block: Supraclavicular block   Pre-Anesthetic Checklist: ,, timeout performed, Correct Patient, Correct Site, Correct Laterality, Correct Procedure, Correct Position, site marked, Risks and benefits discussed,  Surgical consent,  Pre-op evaluation,  At surgeon's request and post-op pain management  Laterality: Left  Prep: chloraprep       Needles:  Injection technique: Single-shot  Needle Type: Echogenic Needle     Needle Length: 5cm  Needle Gauge: 21     Additional Needles:   Procedures:,,,, ultrasound used (permanent image in chart),,,,  Narrative:  Start time: 08/05/2020 8:45 AM End time: 08/05/2020 8:53 AM Injection made incrementally with aspirations every 5 mL.  Performed by: Personally  Anesthesiologist: Barnet Glasgow, MD

## 2020-08-05 NOTE — Anesthesia Postprocedure Evaluation (Signed)
Anesthesia Post Note  Patient: Brittany Berger  Procedure(s) Performed: OPEN REDUCTION INTERNAL FIXATION (ORIF)  LEFT WRIST FRACTURE (Left Wrist)     Patient location during evaluation: PACU Anesthesia Type: Regional Level of consciousness: awake and alert Pain management: pain level controlled Vital Signs Assessment: post-procedure vital signs reviewed and stable Respiratory status: spontaneous breathing, nonlabored ventilation, respiratory function stable and patient connected to nasal cannula oxygen Cardiovascular status: stable and blood pressure returned to baseline Postop Assessment: no apparent nausea or vomiting Anesthetic complications: no   No complications documented.  Last Vitals:  Vitals:   08/05/20 1045 08/05/20 1117  BP: (!) 108/52 (!) 112/57  Pulse: 91 87  Resp: 12 20  Temp: 36.8 C 36.6 C  SpO2: 94% 92%    Last Pain:  Vitals:   08/05/20 1117  TempSrc: Oral  PainSc: 0-No pain                 Barnet Glasgow

## 2020-08-05 NOTE — Progress Notes (Signed)
OT Cancellation Note  Patient Details Name: Brittany Berger MRN: [id number] DOB: 01/16/1951   Cancelled Treatment:    Reason Eval/Treat Not Completed: Patient at procedure or test/ unavailable Pt just getting back from surgery on L wrist. Will check back as time permits.   Corinne [place], [place] [facility] [place] 08/05/2020, 11:55 AM

## 2020-08-05 NOTE — Progress Notes (Signed)
CHG completed now and pt has just voided.  NPO except for sip with med.  Report given to Melvenia Beam, RN in Short Stay Surgical area.  No dentures or jewelry.  Pt did have breast cancer in 2018 and had lymph nodes removed so has limited access on that arm.  No IV access at present but Heath Lark stated they will insert IV down there.

## 2020-08-05 NOTE — Progress Notes (Signed)
Pt received back from PACU, husband at bedside. Left arm in sling.

## 2020-08-05 NOTE — Progress Notes (Signed)
   08/05/20 2052  Assess: MEWS Score  Temp 98.5 F (36.9 C)  BP 139/67  Pulse Rate (!) 115  Resp 16  Level of Consciousness Alert  SpO2 96 %  O2 Device Room Air  Assess: MEWS Score  MEWS Temp 0  MEWS Systolic 0  MEWS Pulse 2  MEWS RR 0  MEWS LOC 0  MEWS Score 2  MEWS Score Color Yellow  Assess: if the MEWS score is Yellow or Red  Were vital signs taken at a resting state? No (pt ambulated from bedside commode to bed)  Focused Assessment Change from prior assessment (see assessment flowsheet)  Early Detection of Sepsis Score *See Row Information* Low  Treat  MEWS Interventions Administered scheduled meds/treatments;Other (Comment) (allowed pt to rest after ambulated )  Pain Scale 0-10  Pain Score 3  Pain Type Acute pain  Pain Location Leg  Pain Orientation Left  Pain Descriptors / Indicators Aching;Sore  Pain Frequency Intermittent  Pain Onset With Activity  Patients Stated Pain Goal 0  Pain Intervention(s) Medication (See eMAR);Repositioned  Multiple Pain Sites No  Escalate  MEWS: Escalate Yellow: discuss with charge nurse/RN and consider discussing with provider and RRT  Notify: Charge Nurse/RN  Name of Charge Nurse/RN Notified Izora Gala, RN  Date Charge Nurse/RN Notified 08/05/20  Time Charge Nurse/RN Notified 2106  Document  Progress note created (see row info) Yes

## 2020-08-05 NOTE — Progress Notes (Signed)
Physical Therapy Treatment Patient Details Name: Vanilla Heatherington MRN: [id number] DOB: 1951-10-31 Today's Date: 08/05/2020    History of Present Illness 16 yof who fell 20 stairs down into a basement, hit head, no loc. Complains of left ribs, hip pain and wrist pain. L radius fx, L 3-7 rib fractures. Tiny L pnuemothorax.    PT Comments    Patient received sitting up on side of bed, husband present. Reports she would like to go to the bathroom. Patient with decreased safety awareness. Min assist needed for sit to stand.  She reports L LE pain with standing and walking. L UE is numb at this time from surgery this morning. She ambulated to bathroom toilet, 15', with min assist, no AD. Min guard for balance with peri care. Ambulated another 10 feet to recliner. Chair alarm set. She will continue to benefit from skilled PT while here to improve functional mobility, safety and strength for return home safely.     Follow Up Recommendations  Home health PT;Supervision/Assistance - 24 hour     Equipment Recommendations  None recommended by PT    Recommendations for Other Services       Precautions / Restrictions Precautions Precautions: Fall Required Braces or Orthoses: Sling Splint/Cast: Left UE sling s/p surgery Restrictions Weight Bearing Restrictions: Yes LUE Weight Bearing: Non weight bearing Other Position/Activity Restrictions: no formal orders, but assumed NWB in LUE    Mobility  Bed Mobility               General bed mobility comments: patient received sitting up on side of bed, husband present. She reports she has to go to the bathroom.  Transfers Overall transfer level: Needs assistance Equipment used: None Transfers: Sit to/from Stand Sit to Stand: Min assist            Ambulation/Gait Ambulation/Gait assistance: Min assist Gait Distance (Feet): 25 Feet Assistive device: None Gait Pattern/deviations: Step-to pattern;Shuffle;Decreased stride length Gait  velocity: decreased   General Gait Details: able to ambulate from far side of bed to bathroom, then to recliner, relies on support for balance at this time.   Stairs             Wheelchair Mobility    Modified Rankin (Stroke Patients Only)       Balance Overall balance assessment: Needs assistance Sitting-balance support: Feet supported Sitting balance-Leahy Scale: Good     Standing balance support: During functional activity;No upper extremity supported Standing balance-Leahy Scale: Fair Standing balance comment: reliant on support at this time                            Cognition Arousal/Alertness: Awake/alert Behavior During Therapy: WFL for tasks assessed/performed Overall Cognitive Status: Within Functional Limits for tasks assessed                                        Exercises      General Comments        Pertinent Vitals/Pain Pain Assessment: Faces Faces Pain Scale: Hurts a little bit Pain Location: Reports L UE is still numb and feels like it weighs a ton, reports some L LE pain with mobility. Pain Descriptors / Indicators: Numbness Pain Intervention(s): Monitored during session    Home Living  Prior Function            PT Goals (current goals can now be found in the care plan section) Acute Rehab PT Goals Patient Stated Goal: to return home PT Goal Formulation: With patient/family Time For Goal Achievement: 08/10/20 Potential to Achieve Goals: Good Progress towards PT goals: Progressing toward goals    Frequency    Min 3X/week      PT Plan Current plan remains appropriate    Co-evaluation              AM-PAC PT "6 Clicks" Mobility   Outcome Measure  Help needed turning from your back to your side while in a flat bed without using bedrails?: A Little Help needed moving from lying on your back to sitting on the side of a flat bed without using bedrails?: A  Little Help needed moving to and from a bed to a chair (including a wheelchair)?: A Little Help needed standing up from a chair using your arms (e.g., wheelchair or bedside chair)?: A Little Help needed to walk in hospital room?: A Little Help needed climbing 3-5 steps with a railing? : A Lot 6 Click Score: 17    End of Session Equipment Utilized During Treatment: Gait belt Activity Tolerance: Patient tolerated treatment well Patient left: in chair;with call bell/phone within reach;with chair alarm set;with family/visitor present Nurse Communication: Mobility status PT Visit Diagnosis: Muscle weakness (generalized) (M62.81);Other abnormalities of gait and mobility (R26.89);History of falling (Z91.81) Pain - Right/Left: Left Pain - part of body: Arm;Leg     Time: 8335-8251 PT Time Calculation (min) (ACUTE ONLY): 27 min  Charges:  $Gait Training: 8-22 mins $Therapeutic Activity: 8-22 mins                     Attilio Zeitler, PT, GCS 08/05/20,1:54 PM

## 2020-08-06 ENCOUNTER — Encounter (HOSPITAL_COMMUNITY): Payer: Self-pay | Admitting: Orthopedic Surgery

## 2020-08-06 LAB — CBC
HCT: 30 % — ABNORMAL LOW (ref 36.0–46.0)
Hemoglobin: 9.7 g/dL — ABNORMAL LOW (ref 12.0–15.0)
MCH: 29.3 pg (ref 26.0–34.0)
MCHC: 32.3 g/dL (ref 30.0–36.0)
MCV: 90.6 fL (ref 80.0–100.0)
Platelets: 206 10*3/uL (ref 150–400)
RBC: 3.31 MIL/uL — ABNORMAL LOW (ref 3.87–5.11)
RDW: 13.2 % (ref 11.5–15.5)
WBC: 10 10*3/uL (ref 4.0–10.5)
nRBC: 0 % (ref 0.0–0.2)

## 2020-08-06 MED ORDER — METHOCARBAMOL 500 MG PO TABS: 500.0000 mg | ORAL_TABLET | Freq: Three times a day (TID) | ORAL | 0 refills | Status: AC

## 2020-08-06 MED ORDER — OXYCODONE HCL 5 MG PO TABS: 5.0000 mg | ORAL_TABLET | Freq: Four times a day (QID) | ORAL | 0 refills | Status: DC | PRN

## 2020-08-06 MED ORDER — OXYCODONE HCL 5 MG PO TABS: 5.0000 mg | ORAL_TABLET | Freq: Four times a day (QID) | ORAL | 0 refills | Status: AC | PRN

## 2020-08-06 MED ORDER — ACETAMINOPHEN 325 MG PO TABS: 1000.0000 mg | ORAL_TABLET | Freq: Four times a day (QID) | ORAL | 0 refills | Status: AC | PRN

## 2020-08-06 MED FILL — METHOCARBAMOL 500 MG TABS: 500 | 10 days supply | Qty: 30 | Fill #0

## 2020-08-06 MED FILL — oxyCODONE HCL 5 MG TABS: 5 | 5 days supply | Qty: 20 | Fill #0

## 2020-08-06 NOTE — Discharge Summary (Signed)
Patient ID: Brittany Berger [id number] 09/08/1951 69 y.o.  Admit date: 08/02/2020 Discharge date: 08/06/2020  Admitting Diagnosis: Fall down stairs Left radius fx Left 3-7 rib fx/ ptx on ct scan  Discharge Diagnosis Patient Active Problem List   Diagnosis Date Noted  . Fall 08/02/2020  Fall down stairs Left radius fx Left 3-7 rib fx/ ptx on ct scan Left hip pain  Left knee pain HTN HLD GERD ABL anemia  Consultants Dr. Percell Miller, ortho  Reason for Admission: 18 yof who fell 20 stairs down into a basement, hit head, no loc. Complains of left ribs, hip pain and wrist pain.  She was in house she had not been in and did not know there were stairs, no real history of falls in past.  She underwent evaluation and was found to have left rib fx with apical ptx on ct scan and I was called.   Procedures Dr. Percell Miller, 08/05/20  ORIF of L wrist FX  Hospital Course:  Fall down stairs  Left radius fx The patient had a splint placed and was then seen by ortho.  She was taken to [facility] for an ORIF of this fracture.  She tolerated this well and was in a splint and sling post op.  She worked with therapies who recommended [facility] PT/OT.  Left 3-7 rib fx/ ptx on ct scan She underwent pain control withmultimodalmedications. She had pulm toilet with IS.  Her repeat CXR the day following admission revealed no further PTX.    Left hip pain  She had some hip pain.  Her plain films were negative.  She developed a large ecchymosis.  She mobilized with therapies and required some HH PT/OT.  Left knee pain  Plain films were obtained which were negative.  This seemed to resolve.  HTN HLD GERD All of the above are chronic medical problems that remained stable during her admission.  ABL anemia  As expected she had a drop in her hgb from 11 to 9.6.  This remained stable and did not require any further intervention.  Physical Exam: Gen: Alert, NAD, pleasant HEENT: EOM's intact, pupils equal and  round Card: RRR Pulm: CTAB,chest tenderness as expected. Abd: Soft, NT/ND, +BS. Ext:L wrist in splint and sling. Moves all digits.  Left hip with significant ecchymosis present Psych: A&Ox3  Skin: no rashes noted, warm and dry  Allergies as of 08/06/2020      Reactions   Pregabalin Shortness Of Breath   Ace Inhibitors Other (See Comments)   Hydromorphone Itching   Meperidine Itching      Medication List    STOP taking these medications   albuterol 108 (90 Base) MCG/ACT inhaler Commonly known as: VENTOLIN HFA   budesonide-formoterol 160-4.5 MCG/ACT inhaler Commonly known as: Symbicort   DULoxetine 60 MG capsule Commonly known as: CYMBALTA   fluticasone 50 MCG/ACT nasal spray Commonly known as: FLONASE   gabapentin 600 MG tablet Commonly known as: NEURONTIN   montelukast 10 MG tablet Commonly known as: SINGULAIR   Spiriva Respimat 2.5 MCG/ACT Aers Generic drug: Tiotropium Bromide Monohydrate   zolpidem 10 MG tablet Commonly known as: AMBIEN     TAKE these medications   acetaminophen 325 MG tablet Commonly known as: TYLENOL Take 3 tablets (975 mg total) by mouth every 6 (six) hours as needed.   amLODipine 5 MG tablet Commonly known as: NORVASC TAKE 1 TABLET EVERY DAY   atorvastatin 10 MG tablet Commonly known as: LIPITOR Take 10 mg by mouth daily.  baclofen 10 MG tablet Commonly known as: LIORESAL Take 10 mg by mouth at bedtime.   buPROPion 150 MG 24 hr tablet Commonly known as: WELLBUTRIN XL Take by mouth.   diclofenac 75 MG EC tablet Commonly known as: VOLTAREN Take 75 mg by mouth 2 (two) times daily.   diltiazem 120 MG 24 hr capsule Commonly known as: CARDIZEM CD Take 120 mg by mouth daily.   exemestane 25 MG tablet Commonly known as: AROMASIN Take 25 mg by mouth daily.   furosemide 20 MG tablet Commonly known as: LASIX Take 20 mg by mouth daily as needed.   letrozole 2.5 MG tablet Commonly known as: FEMARA Take by mouth.    LORazepam 1 MG tablet Commonly known as: ATIVAN Take 1 mg by mouth at bedtime as needed for anxiety.   methocarbamol 500 MG tablet Commonly known as: ROBAXIN Take 1 tablet (500 mg total) by mouth 3 (three) times daily.   omeprazole 20 MG capsule Commonly known as: PRILOSEC Take 20 mg by mouth daily.   oxyCODONE 5 MG immediate release tablet Commonly known as: Roxicodone Take 1 tablet (5 mg total) by mouth every 6 (six) hours as needed for up to 5 days.   potassium chloride 10 MEQ CR capsule Commonly known as: MICRO-K Take 10 mEq by mouth daily.   QUEtiapine 100 MG tablet Commonly known as: SEROQUEL TAKE 0.5 1 (ONE HALF TO ONE) TABLET BY MOUTH AT BEDTIME FOR MOOD STABILIZER INSOMNIA   SM OYSTER SHELL CALCIUM/VIT D3 PO Take by mouth.   triamcinolone cream 0.1 % Commonly known as: KENALOG Apply 1 application topically 2 (two) times daily.   triamterene-hydrochlorothiazide 37.5-25 MG capsule Commonly known as: DYAZIDE Take 1 capsule by mouth daily.   Vitamin B-Complex Tabs Take by mouth.         Follow-up Information    Renette Butters, MD In 2 weeks.   Specialty: Orthopedic Surgery Contact information: [address] [place] [address] [phone]        Leota Jacobsen, MD Follow up.   Specialty: Family Medicine Why: as needed for rib fractures Contact information: [place] [address] [phone]               Signed: Saverio Danker, [facility] Surgery 08/06/2020, 10:52 AM Please see Amion for pager number during day hours 7:00am-4:30pm, 7-11:30am on Weekends

## 2020-08-06 NOTE — Progress Notes (Signed)
Physical Therapy Treatment Patient Details Name: Brittany Berger MRN: [id number] DOB: 1951-11-15 Today's Date: 08/06/2020    History of Present Illness 58 yof who fell 20 stairs down into a basement, hit head, no loc. Complains of left ribs, hip pain and wrist pain. L radius fx, L 3-7 rib fractures. Tiny L pnuemothorax. S/p ORIF to L wrist 8/31.    PT Comments    Pt seated in recliner on arrival.  Focused on progression of gt training for safety and issuing HEP for home use and continued strengthening.  Pt performed a series of seated and standing exercises.  HEP access code-[email or web address].  Pt to d/c home with support from her spouse and HHPT.     Follow Up Recommendations  Home health PT;Supervision/Assistance - 24 hour     Equipment Recommendations  None recommended by PT    Recommendations for Other Services       Precautions / Restrictions Precautions Precautions: Fall Required Braces or Orthoses: Splint/Cast Splint/Cast: L wrist support. Restrictions Weight Bearing Restrictions: Yes LUE Weight Bearing: Non weight bearing Other Position/Activity Restrictions: Educated patient regarding no pushing/pulling LUE, and not to lift anything greater than a small cup of water.    Mobility  Bed Mobility               General bed mobility comments: Pt seated in recliner on arrival this session.  Transfers Overall transfer level: Needs assistance Equipment used: None Transfers: Sit to/from Stand Sit to Stand: Min assist         General transfer comment: Min to min guard with cues for hand placement. min assistance to elevate trunk away from back of recliner.  Ambulation/Gait Ambulation/Gait assistance: Min assist Gait Distance (Feet): 150 Feet Assistive device: None Gait Pattern/deviations: Step-to pattern;Shuffle;Decreased stride length Gait velocity: decreased   General Gait Details: Cues for upper trunk control and weight shifting.  Pt fatigues quickly.  HR elevated to  133 bpm.  SPO2 90%-94% on RA.   Stairs             Wheelchair Mobility    Modified Rankin (Stroke Patients Only)       Balance Overall balance assessment: Needs assistance Sitting-balance support: Feet supported Sitting balance-Leahy Scale: Good       Standing balance-Leahy Scale: Fair                              Cognition Arousal/Alertness: Awake/alert Behavior During Therapy: WFL for tasks assessed/performed Overall Cognitive Status: Within Functional Limits for tasks assessed                                        Exercises General Exercises - Lower Extremity Ankle Circles/Pumps: AROM;Both;10 reps;Supine Long Arc Quad: AROM;Both;10 reps;Seated Hip ABduction/ADduction: AROM;Both;5 reps;Seated Hip Flexion/Marching: AROM;Both;10 reps;Seated Heel Raises: AROM;Both;10 reps;Standing Mini-Sqauts: AROM;Both;5 reps;Standing Shoulder Exercises Shoulder Flexion: AROM;10 reps;Seated Shoulder ABduction: AROM;10 reps;Seated Elbow Flexion: AROM;10 reps;Seated Elbow Extension: AROM;10 reps;Seated Hand Exercises Forearm Supination: AROM;10 reps;Seated Forearm Pronation: AROM;10 reps;Seated Digit Composite Flexion: AROM;20 reps;Seated Thumb Abduction: AROM;10 reps;Seated Thumb Adduction: AROM;10 reps;Seated    General Comments        Pertinent Vitals/Pain Pain Assessment: 0-10 Pain Score: 6  Pain Location: L ribs and L wrist Pain Descriptors / Indicators: Sharp (utilized pillow to splint rib cage) Pain Intervention(s): Monitored during session;Repositioned    Home Living  Prior Function            PT Goals (current goals can now be found in the care plan section) Acute Rehab PT Goals Patient Stated Goal: to return home Potential to Achieve Goals: Good Progress towards PT goals: Progressing toward goals    Frequency    Min 3X/week      PT Plan Current plan remains appropriate     Co-evaluation              AM-PAC PT "6 Clicks" Mobility   Outcome Measure  Help needed turning from your back to your side while in a flat bed without using bedrails?: A Little Help needed moving from lying on your back to sitting on the side of a flat bed without using bedrails?: A Little Help needed moving to and from a bed to a chair (including a wheelchair)?: A Little Help needed standing up from a chair using your arms (e.g., wheelchair or bedside chair)?: A Little Help needed to walk in hospital room?: A Little Help needed climbing 3-5 steps with a railing? : A Little 6 Click Score: 18    End of Session Equipment Utilized During Treatment: Gait belt Activity Tolerance: Patient tolerated treatment well Patient left: in chair;with call bell/phone within reach;with chair alarm set;with family/visitor present Nurse Communication: Mobility status PT Visit Diagnosis: Muscle weakness (generalized) (M62.81);Other abnormalities of gait and mobility (R26.89);History of falling (Z91.81) Pain - Right/Left: Left Pain - part of body: Arm;Leg     Time: 1253-1316 PT Time Calculation (min) (ACUTE ONLY): 23 min  Charges:  $Gait Training: 8-22 mins $Therapeutic Exercise: 8-22 mins                     Erasmo Leventhal , PTA Acute Rehabilitation Services Pager [phone] Office [phone]     Arlis Yale Eli Hose 08/06/2020, 2:07 PM

## 2020-08-06 NOTE — Progress Notes (Signed)
    Subjective: 69yof POD #1 s/p ORIF of left wrist.  Patient reports pain as mild}.  Tolerating diet.  Urinating.  +Flatus.  No CP, SOB, paraesthesia.   Objective:   VITALS:   Vitals:   08/05/20 2052 08/05/20 2154 08/06/20 0218 08/06/20 0619  BP: 139/67 133/72 (!) 99/57 128/61  Pulse: (!) 115 (!) 103 87 86  Resp: 16 16 17 17   Temp: 98.5 F (36.9 C) 98.6 F (37 C) 98.9 F (37.2 C) 98.4 F (36.9 C)  TempSrc: Oral Oral Oral Oral  SpO2: 96% 94% 90% 100%  Weight:      Height:       CBC Latest Ref Rng & Units 08/06/2020 08/05/2020 08/02/2020  WBC 4.0 - 10.5 K/uL 10.0 7.8 14.4(H)  Hemoglobin 12.0 - 15.0 g/dL 9.7(L) 9.6(L) 11.7(L)  Hematocrit 36 - 46 % 30.0(L) 31.2(L) 37.6  Platelets 150 - 400 K/uL 206 167 217   BMP Latest Ref Rng & Units 08/05/2020 08/02/2020  Glucose 70 - 99 mg/dL 125(H) 123(H)  BUN 8 - 23 mg/dL 18 24(H)  Creatinine 0.44 - 1.00 mg/dL 0.85 1.04(H)  Sodium 135 - 145 mmol/L 141 139  Potassium 3.5 - 5.1 mmol/L 3.7 4.0  Chloride 98 - 111 mmol/L 104 104  CO2 22 - 32 mmol/L 29 25  Calcium 8.9 - 10.3 mg/dL 8.8(L) 8.8(L)   Intake/Output      08/31 0701 - 09/01 0700 09/01 0701 - 09/02 0700   P.O. 460    I.V. (mL/kg) 600 (5.8)    IV Piggyback 100    Total Intake(mL/kg) 1160 (11.1)    Urine (mL/kg/hr) 700 (0.3)    Total Output 700    Net +460         Urine Occurrence 1 x       Physical Exam: General: NAD. Resting comfortably in bed.  Neurologically intact MSK Neurovascularly intact Sensation intact distally Incision: dressing C/D/I CAP refill less then 2s  Assessment: 1 Day Post-Op  S/P Procedure(s) (LRB): OPEN REDUCTION INTERNAL FIXATION (ORIF)  LEFT WRIST FRACTURE (Left) by Dr. Ernesta Amble. Murphy on 08/05/20  Active Problems:   Fall    Plan:   Incentive Spirometry Elevate and Apply ice  Weightbearing: PWB LUE; no more then 5 lbs.  LUE Insicional and dressing care: Dressings left intact until follow-up Orthopedic device(s): None and  Splint Showering: Keep dressing dry VTE prophylaxis: SCDs, ambulation Pain control: recommend oxycodone 5mg  q6h PRN; tylenol 1000mg  q6h PRN for pain.  Follow - up plan: 1- 2 weeks Contact information:  Edmonia Lynch MD, Elio Forget Dispo: okay for d/c to home today from ortho standpoint.     Rachael Fee, PA-C 08/06/2020, 8:03 AM

## 2020-08-06 NOTE — Progress Notes (Signed)
Occupational Therapy Treatment Patient Details Name: Brittany Berger MRN: [id number] DOB: 01-Nov-1951 Today's Date: 08/06/2020    History of present illness 79 yof who fell 20 stairs down into a basement, hit head, no loc. Complains of left ribs, hip pain and wrist pain. L radius fx, L 3-7 rib fractures. Tiny L pnuemothorax. S/p ORIF to L wrist 8/31.   OT comments  Patient up in recliner, L ribs sore, patient just had a coughing spell.  Discussed pillow to squeeze - patient verbalized understanding.  Minimal L wrist pain and wrist support in place.  OT reviewed HEP with patient and spouse, good understanding and patient able to complete.  Discussed strategies for LB ADL given wrist restrictions and support in place.  Patient will continue to need assist as needed for bathing and dressing at home via spouse.  Mobility is improving.  [place] OT has been ordered, and acute OT to defer to [facility] to further progress HEP and ADL/IADL status at home.  No further acute needs.    Follow Up Recommendations  Home health OT    Equipment Recommendations       Recommendations for Other Services      Precautions / Restrictions Precautions Precautions: Fall Required Braces or Orthoses: Splint/Cast Splint/Cast: L wrist support. Restrictions LUE Weight Bearing: Non weight bearing Other Position/Activity Restrictions: Educated patient regarding no pushing/pulling LUE, and not to lift anything greater than a small cup of water.       Mobility Bed Mobility                  Transfers                 General transfer comment: Initially min guard - gradually SBA for functional mobility in the room    Balance                                           ADL either performed or assessed with clinical judgement   ADL Overall ADL's : Needs assistance/impaired     Grooming: Oral care;Supervision/safety               Lower Body Dressing: Supervision/safety;Set up;Moderate  assistance;Sitting/lateral leans Lower Body Dressing Details (indicate cue type and reason): demostrated one handed technique for donning socks.               General ADL Comments: Patient generally min guard at first, then gadually SBA for basic mobility in the room.         Exercises Shoulder Exercises Shoulder Flexion: AROM;10 reps;Seated Shoulder ABduction: AROM;10 reps;Seated Elbow Flexion: AROM;10 reps;Seated Elbow Extension: AROM;10 reps;Seated Hand Exercises Forearm Supination: AROM;10 reps;Seated Forearm Pronation: AROM;10 reps;Seated Digit Composite Flexion: AROM;20 reps;Seated Thumb Abduction: AROM;10 reps;Seated Thumb Adduction: AROM;10 reps;Seated   Shoulder Instructions       General Comments      Pertinent Vitals/ Pain       Pain Assessment: 0-10 Pain Score: 3  Pain Location: L ribs and L wrist.  Just received pain meds. Pain Descriptors / Indicators: Dull Pain Intervention(s): Monitored during session;Repositioned  Home Living                                           Progress Toward Goals  OT Goals(current  goals can now be found in the care plan section)        Plan      Co-evaluation                 AM-PAC OT "6 Clicks" Daily Activity     Outcome Measure   Help from another person eating meals?: A Little Help from another person taking care of personal grooming?: A Little Help from another person toileting, which includes using toliet, bedpan, or urinal?: A Little Help from another person bathing (including washing, rinsing, drying)?: A Lot Help from another person to put on and taking off regular upper body clothing?: A Little Help from another person to put on and taking off regular lower body clothing?: A Lot 6 Click Score: 16    End of Session    OT Visit Diagnosis: Other abnormalities of gait and mobility (R26.89);Pain Pain - Right/Left: Left Pain - part of body: Hand (ribs)   Activity Tolerance  Patient tolerated treatment well   Patient Left in chair;with family/visitor present   Nurse Communication          Time: 5465-6812 OT Time Calculation (min): 25 min  Charges: OT General Charges $OT Visit: 1 Visit OT Treatments $Self Care/Home Management : 8-22 mins $Therapeutic Exercise: 8-22 mins  08/06/2020  Rich, OTR/L  Acute Rehabilitation Services  Office:  [phone]    [name] 08/06/2020, 1:08 PM

## 2020-08-06 NOTE — TOC Transition Note (Signed)
Transition of Care [facility]) - CM/SW Discharge Note   Patient Details  Name: Brittany Berger MRN: [id number] Date of Birth: October 01, 1951  [facility] CM/SW Contact:  Ella Bodo, RN Phone Number: 08/06/2020, 1:15 PM   Clinical Narrative:  65 yof who fell 20 stairs down into a basement, hit head, no loc. Complains of left ribs, hip pain and wrist pain. L radius fx, L 3-7 rib fractures. Tiny L pnuemothorax. S/p ORIF to L wrist 8/31.  PTA, pt independent, lives at home with spouse, who can provide care at dc.  PT/OT recommending HH, and pt agreeable to services.  Referral to [facility] for follow up.  No DME needs, as pt has all needed DME at home.      Final next level of care: Home w Home Health Services Barriers to Discharge: Barriers Resolved   Patient Goals and CMS Choice   CMS Medicare.gov Compare Post Acute Care list provided to:: Patient Choice offered to / list presented to : Patient  Discharge Placement                       Discharge Plan and Services   Discharge Planning Services: CM Consult Post Acute Care Choice: Home Health                    HH Arranged: PT, OT [facility] Agency: [place] Date [place]: 08/06/20 Time [place]: 6734 Representative spoke with at [place]: Adela Lank  Social Determinants of Health ([place]) Interventions     Readmission Risk Interventions Readmission Risk Prevention Plan 08/06/2020  [place] Appt Complete  Medication Screening Complete  Transportation Screening Complete   Reinaldo Raddle, RN, BSN  Trauma/Neuro ICU Case Manager [phone]

## 2020-08-06 NOTE — Discharge Instructions (Signed)
Keep wrist elevated with ice as much as possible to reduce pain and swelling. If needed, you may increase pain medication for the first few days post op to 2 tablets every 4 hours.  Stop as needed pain medication as soon as you are able.  Diet: As you were doing prior to hospitalization   Shower:  You have a splint on, leave the splint in place and keep the splint dry with a plastic bag.  Dressing:  You have a splint - leave the splint in place and we will change your bandages during your first follow-up appointment.    Activity:  Increase activity slowly as tolerated, but follow the weight bearing instructions below.  The rules on driving is that you can not be taking narcotics while you drive, and you must feel in control of the vehicle.    Weight Bearing:  Non weight bearing affected wrist.  Sling for comfort.   To prevent constipation: you may use a stool softener such as -  Colace (over the counter) 100 mg by mouth twice a day  Drink plenty of fluids (prune juice may be helpful) and high fiber foods Miralax (over the counter) for constipation as needed.    Itching:  If you experience itching with your medications, try taking only a single pain pill, or even half a pain pill at a time.  You can also use benadryl over the counter for itching or also to help with sleep.   Precautions:  If you experience chest pain or shortness of breath - call 911 immediately for transfer to the hospital emergency department!!  If you develop a fever greater that 101 F, purulent drainage from wound, increased redness or drainage from wound, or calf pain -- Call the office at [phone]                                         Follow- Up Appointment:  Please call for an appointment to be seen in 2 weeks [place] - [phone]   Rib Fracture  A rib fracture is a break or crack in one of the bones of the ribs. The ribs are like a cage that goes around your upper chest. A broken or cracked rib is  often painful, but most do not cause other problems. Most rib fractures usually heal on their own in 1-3 months. Follow these instructions at home: Managing pain, stiffness, and swelling  If directed, apply ice to the injured area. ? Put ice in a plastic bag. ? Place a towel between your skin and the bag. ? Leave the ice on for 20 minutes, 2-3 times a day.  Take over-the-counter and prescription medicines only as told by your doctor. Activity  Avoid activities that cause pain to the injured area. Protect your injured area.  Slowly increase activity as told by your doctor. General instructions  Do deep breathing as told by your doctor. You may be told to: ? Take deep breaths many times a day. ? Cough many times a day while hugging a pillow. ? Use a device (incentive spirometer) to do deep breathing many times a day.  Drink enough fluid to keep your pee (urine) clear or pale yellow.  Do not wear a rib belt or binder. These do not allow you to breathe deeply.  Keep all follow-up visits as told by your doctor. This is important. Contact  a doctor if:  You have a fever. Get help right away if:  You have trouble breathing.  You are short of breath.  You cannot stop coughing.  You cough up thick or bloody spit (sputum).  You feel sick to your stomach (nauseous), throw up (vomit), or have belly (abdominal) pain.  Your pain gets worse and medicine does not help. Summary  A rib fracture is a break or crack in one of the bones of the ribs.  Apply ice to the injured area and take medicines for pain as told by your doctor.  Take deep breaths and cough many times a day. Hug a pillow every time you cough. This information is not intended to replace advice given to you by your health care provider. Make sure you discuss any questions you have with your health care provider. Document Revised: 11/04/2017 Document Reviewed: 02/22/2017 Elsevier Patient Education  2020 [employer].

## 2020-08-08 ENCOUNTER — Telehealth: Payer: Self-pay | Admitting: *Deleted

## 2020-08-08 NOTE — Telephone Encounter (Signed)
Patient called and notified of potential exposure to employee that later tested positive for COVID-19 during recent visit. Understanding verbalized. Patient scheduled for testing on 9/7 at 1pm.

## 2020-08-12 ENCOUNTER — Other Ambulatory Visit (HOSPITAL_COMMUNITY)
Admission: RE | Admit: 2020-08-12 | Discharge: 2020-08-12 | Disposition: A | Discharge status: Home / Self Care | Payer: Medicare Other | Source: Ambulatory Visit | Attending: Internal Medicine | Admitting: Internal Medicine

## 2020-08-12 ENCOUNTER — Encounter (HOSPITAL_COMMUNITY): Payer: Self-pay | Admitting: Physician Assistant

## 2020-08-12 DIAGNOSIS — Z01812 Encounter for preprocedural laboratory examination: Secondary | ICD-10-CM | POA: Diagnosis present

## 2020-08-12 DIAGNOSIS — Z20822 Contact with and (suspected) exposure to covid-19: Secondary | ICD-10-CM | POA: Insufficient documentation

## 2020-08-12 LAB — SARS CORONAVIRUS 2 BY RT PCR (HOSPITAL ORDER, PERFORMED IN ~~LOC~~ HOSPITAL LAB): SARS Coronavirus 2: NEGATIVE

## 2021-04-17 IMAGING — DX DG CHEST 1V PORT
1 series · 1 of 1 positions shown · non-contrast
Comparison: 08/02/2020, CT chest 08/02/2020

CLINICAL DATA: Pneumothorax

EXAM:
PORTABLE CHEST 1 VIEW

[chest]
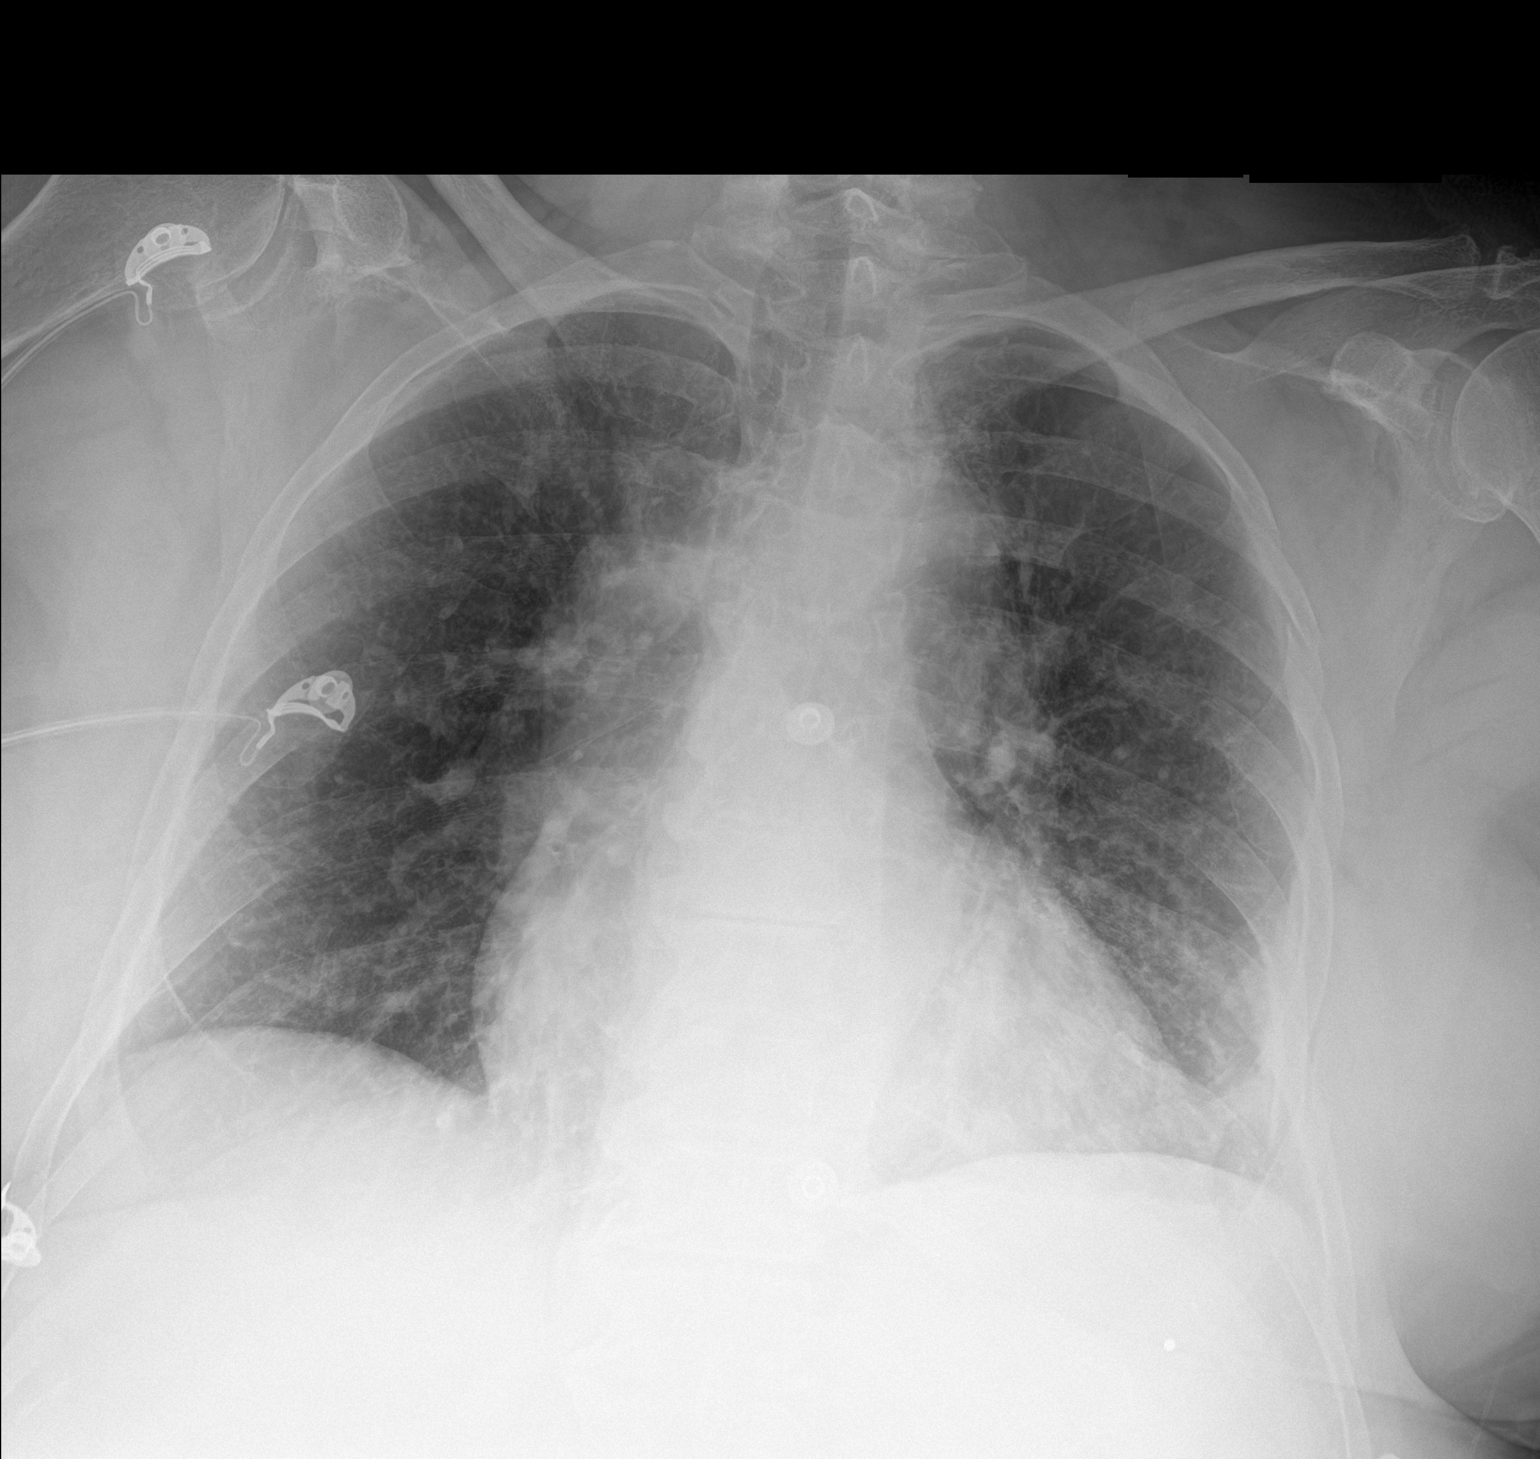

[1 of 1 positions shown; findings below may reference images not displayed]

FINDINGS: Tiny pneumothorax seen by CT is not well seen radiographically.
There is cardiomegaly with prominent central pulmonary vessels. No
pleural effusion. Multiple acute left-sided rib fractures. Patchy
airspace disease at the left base.
IMPRESSION: 1. Tiny left apical and anterolateral pneumothorax seen by CT is not
well seen radiographically.
2. Cardiomegaly with prominent central pulmonary vessels. Minimal
airspace disease at the left base
